# Patient Record
Sex: Female | Born: 1935 | Race: White | Hispanic: No | Marital: Single | Smoking: Former smoker
Health system: Southern US, Community
[De-identification: ages and names within clinical notes are randomized; demographics above are authoritative.]

## PROBLEM LIST (undated history)

## (undated) DIAGNOSIS — E1165 Type 2 diabetes mellitus with hyperglycemia: Secondary | ICD-10-CM

## (undated) DIAGNOSIS — I25810 Atherosclerosis of coronary artery bypass graft(s) without angina pectoris: Secondary | ICD-10-CM

## (undated) DIAGNOSIS — I10 Essential (primary) hypertension: Secondary | ICD-10-CM

## (undated) DIAGNOSIS — IMO0002 Reserved for concepts with insufficient information to code with codable children: Secondary | ICD-10-CM

## (undated) HISTORY — PX: CORONARY ARTERY BYPASS GRAFT: SHX141

---

## 2018-02-24 ENCOUNTER — Inpatient Hospital Stay (HOSPITAL_COMMUNITY)
Admission: AD | Admit: 2018-02-24 | Discharge: 2018-03-16 | DRG: 242 | Disposition: E | Payer: Medicare Other | Attending: Cardiology | Admitting: Cardiology

## 2018-02-24 ENCOUNTER — Encounter (HOSPITAL_COMMUNITY): Admission: AD | Disposition: E | Payer: Self-pay | Source: Home / Self Care | Attending: Cardiology

## 2018-02-24 ENCOUNTER — Encounter (HOSPITAL_COMMUNITY): Payer: Self-pay | Admitting: *Deleted

## 2018-02-24 ENCOUNTER — Other Ambulatory Visit: Payer: Self-pay

## 2018-02-24 ENCOUNTER — Inpatient Hospital Stay (HOSPITAL_COMMUNITY): Payer: Medicare Other

## 2018-02-24 DIAGNOSIS — I442 Atrioventricular block, complete: Secondary | ICD-10-CM | POA: Diagnosis present

## 2018-02-24 DIAGNOSIS — I25810 Atherosclerosis of coronary artery bypass graft(s) without angina pectoris: Secondary | ICD-10-CM | POA: Diagnosis present

## 2018-02-24 DIAGNOSIS — Z87891 Personal history of nicotine dependence: Secondary | ICD-10-CM | POA: Diagnosis not present

## 2018-02-24 DIAGNOSIS — I2102 ST elevation (STEMI) myocardial infarction involving left anterior descending coronary artery: Secondary | ICD-10-CM | POA: Diagnosis not present

## 2018-02-24 DIAGNOSIS — Z9289 Personal history of other medical treatment: Secondary | ICD-10-CM

## 2018-02-24 DIAGNOSIS — A419 Sepsis, unspecified organism: Secondary | ICD-10-CM | POA: Diagnosis not present

## 2018-02-24 DIAGNOSIS — Z978 Presence of other specified devices: Secondary | ICD-10-CM

## 2018-02-24 DIAGNOSIS — I452 Bifascicular block: Secondary | ICD-10-CM | POA: Diagnosis present

## 2018-02-24 DIAGNOSIS — G9340 Encephalopathy, unspecified: Secondary | ICD-10-CM | POA: Diagnosis not present

## 2018-02-24 DIAGNOSIS — I255 Ischemic cardiomyopathy: Secondary | ICD-10-CM | POA: Diagnosis present

## 2018-02-24 DIAGNOSIS — Z9911 Dependence on respirator [ventilator] status: Secondary | ICD-10-CM

## 2018-02-24 DIAGNOSIS — I213 ST elevation (STEMI) myocardial infarction of unspecified site: Secondary | ICD-10-CM | POA: Diagnosis present

## 2018-02-24 DIAGNOSIS — J9690 Respiratory failure, unspecified, unspecified whether with hypoxia or hypercapnia: Secondary | ICD-10-CM | POA: Diagnosis not present

## 2018-02-24 DIAGNOSIS — R579 Shock, unspecified: Secondary | ICD-10-CM | POA: Diagnosis not present

## 2018-02-24 DIAGNOSIS — Z515 Encounter for palliative care: Secondary | ICD-10-CM | POA: Diagnosis present

## 2018-02-24 DIAGNOSIS — J811 Chronic pulmonary edema: Secondary | ICD-10-CM | POA: Diagnosis present

## 2018-02-24 DIAGNOSIS — R6521 Severe sepsis with septic shock: Secondary | ICD-10-CM | POA: Diagnosis not present

## 2018-02-24 DIAGNOSIS — I10 Essential (primary) hypertension: Secondary | ICD-10-CM | POA: Diagnosis present

## 2018-02-24 DIAGNOSIS — Z7189 Other specified counseling: Secondary | ICD-10-CM | POA: Diagnosis not present

## 2018-02-24 DIAGNOSIS — Z66 Do not resuscitate: Secondary | ICD-10-CM | POA: Diagnosis present

## 2018-02-24 DIAGNOSIS — Z951 Presence of aortocoronary bypass graft: Secondary | ICD-10-CM

## 2018-02-24 DIAGNOSIS — J9601 Acute respiratory failure with hypoxia: Secondary | ICD-10-CM

## 2018-02-24 DIAGNOSIS — F0390 Unspecified dementia without behavioral disturbance: Secondary | ICD-10-CM | POA: Diagnosis present

## 2018-02-24 DIAGNOSIS — E1165 Type 2 diabetes mellitus with hyperglycemia: Secondary | ICD-10-CM | POA: Diagnosis present

## 2018-02-24 DIAGNOSIS — Z950 Presence of cardiac pacemaker: Secondary | ICD-10-CM

## 2018-02-24 DIAGNOSIS — I472 Ventricular tachycardia: Secondary | ICD-10-CM | POA: Diagnosis present

## 2018-02-24 DIAGNOSIS — J189 Pneumonia, unspecified organism: Secondary | ICD-10-CM

## 2018-02-24 DIAGNOSIS — IMO0002 Reserved for concepts with insufficient information to code with codable children: Secondary | ICD-10-CM

## 2018-02-24 DIAGNOSIS — I2109 ST elevation (STEMI) myocardial infarction involving other coronary artery of anterior wall: Secondary | ICD-10-CM | POA: Diagnosis present

## 2018-02-24 DIAGNOSIS — I469 Cardiac arrest, cause unspecified: Secondary | ICD-10-CM | POA: Diagnosis not present

## 2018-02-24 DIAGNOSIS — T82128A Displacement of other cardiac electronic device, initial encounter: Secondary | ICD-10-CM

## 2018-02-24 HISTORY — PX: LEFT HEART CATH AND CORONARY ANGIOGRAPHY: CATH118249

## 2018-02-24 HISTORY — DX: Atherosclerosis of coronary artery bypass graft(s) without angina pectoris: I25.810

## 2018-02-24 HISTORY — PX: TEMPORARY PACEMAKER: CATH118268

## 2018-02-24 HISTORY — DX: Reserved for concepts with insufficient information to code with codable children: IMO0002

## 2018-02-24 HISTORY — DX: Essential (primary) hypertension: I10

## 2018-02-24 HISTORY — PX: CORONARY/GRAFT ACUTE MI REVASCULARIZATION: CATH118305

## 2018-02-24 HISTORY — DX: Type 2 diabetes mellitus with hyperglycemia: E11.65

## 2018-02-24 LAB — HEMOGLOBIN A1C
Hgb A1c MFr Bld: 12.3 % — ABNORMAL HIGH (ref 4.8–5.6)
Mean Plasma Glucose: 306.31 mg/dL

## 2018-02-24 LAB — ECHOCARDIOGRAM COMPLETE
Height: 64 in
Weight: 2400 oz

## 2018-02-24 LAB — COMPREHENSIVE METABOLIC PANEL
ALK PHOS: 84 U/L (ref 38–126)
ALT: 38 U/L (ref 0–44)
ANION GAP: 10 (ref 5–15)
AST: 49 U/L — ABNORMAL HIGH (ref 15–41)
Albumin: 3.4 g/dL — ABNORMAL LOW (ref 3.5–5.0)
BUN: 20 mg/dL (ref 8–23)
CALCIUM: 8.9 mg/dL (ref 8.9–10.3)
CO2: 23 mmol/L (ref 22–32)
Chloride: 102 mmol/L (ref 98–111)
Creatinine, Ser: 0.72 mg/dL (ref 0.44–1.00)
GFR calc Af Amer: >60 mL/min (ref >60)
GFR calc non Af Amer: >60 mL/min (ref >60)
Glucose, Bld: 409 mg/dL — ABNORMAL HIGH (ref 70–99)
POTASSIUM: 3.8 mmol/L (ref 3.5–5.1)
SODIUM: 135 mmol/L (ref 135–145)
TOTAL PROTEIN: 5.6 g/dL — AB (ref 6.5–8.1)
Total Bilirubin: 1.2 mg/dL (ref 0.3–1.2)

## 2018-02-24 LAB — MRSA PCR SCREENING: MRSA BY PCR: POSITIVE — AB

## 2018-02-24 LAB — CBC
HEMATOCRIT: 42.3 % (ref 36.0–46.0)
HEMOGLOBIN: 13.9 g/dL (ref 12.0–15.0)
MCH: 29.4 pg (ref 26.0–34.0)
MCHC: 32.9 g/dL (ref 30.0–36.0)
MCV: 89.6 fL (ref 80.0–100.0)
Platelets: 252 10*3/uL (ref 150–400)
RBC: 4.72 MIL/uL (ref 3.87–5.11)
RDW: 13.2 % (ref 11.5–15.5)
WBC: 11.5 10*3/uL — ABNORMAL HIGH (ref 4.0–10.5)
nRBC: 0 % (ref 0.0–0.2)

## 2018-02-24 LAB — LIPID PANEL
CHOL/HDL RATIO: 2.3 ratio
Cholesterol: 152 mg/dL (ref 0–200)
HDL: 67 mg/dL (ref >40)
LDL Cholesterol: 70 mg/dL (ref 0–99)
Triglycerides: 74 mg/dL (ref <150)
VLDL: 15 mg/dL (ref 0–40)

## 2018-02-24 LAB — TROPONIN I
Troponin I: 0.14 ng/mL (ref <0.03)
Troponin I: 5.22 ng/mL (ref <0.03)

## 2018-02-24 LAB — PROTIME-INR
INR: 1.2
Prothrombin Time: 15.1 seconds (ref 11.4–15.2)

## 2018-02-24 LAB — GLUCOSE, CAPILLARY
GLUCOSE-CAPILLARY: 286 mg/dL — AB (ref 70–99)
Glucose-Capillary: 138 mg/dL — ABNORMAL HIGH (ref 70–99)

## 2018-02-24 SURGERY — CORONARY/GRAFT ACUTE MI REVASCULARIZATION
Anesthesia: LOCAL

## 2018-02-24 MED ORDER — MIDAZOLAM HCL 2 MG/2ML IJ SOLN
INTRAMUSCULAR | Status: AC
  Filled 2018-02-24: qty 2

## 2018-02-24 MED ORDER — CLOPIDOGREL BISULFATE 300 MG PO TABS
ORAL_TABLET | ORAL | Status: DC | PRN
  Administered 2018-02-24: 600 mg via ORAL

## 2018-02-24 MED ORDER — LIDOCAINE HCL (PF) 1 % IJ SOLN
INTRAMUSCULAR | Status: AC
  Filled 2018-02-24: qty 30

## 2018-02-24 MED ORDER — SODIUM CHLORIDE 0.9 % IV SOLN
250.0000 mL | INTRAVENOUS | Status: AC | PRN
  Administered 2018-02-24: 250 mL via INTRAVENOUS

## 2018-02-24 MED ORDER — ATORVASTATIN CALCIUM 80 MG PO TABS
80.0000 mg | ORAL_TABLET | Freq: Every day | ORAL | Status: DC
  Administered 2018-02-24 – 2018-02-25 (×2): 80 mg via ORAL
  Filled 2018-02-24 (×2): qty 1

## 2018-02-24 MED ORDER — HEPARIN (PORCINE) IN NACL 1000-0.9 UT/500ML-% IV SOLN
INTRAVENOUS | Status: DC | PRN
  Administered 2018-02-24 (×2): 500 mL

## 2018-02-24 MED ORDER — FUROSEMIDE 10 MG/ML IJ SOLN
INTRAMUSCULAR | Status: AC
  Filled 2018-02-24: qty 4

## 2018-02-24 MED ORDER — LOSARTAN POTASSIUM 25 MG PO TABS
25.0000 mg | ORAL_TABLET | Freq: Every day | ORAL | Status: DC
  Administered 2018-02-24 – 2018-02-25 (×2): 25 mg via ORAL
  Filled 2018-02-24 (×2): qty 1

## 2018-02-24 MED ORDER — SODIUM CHLORIDE 0.9% FLUSH: 3.0000 mL | INTRAVENOUS | Status: DC | PRN

## 2018-02-24 MED ORDER — NOREPINEPHRINE 4 MG/250ML-% IV SOLN
INTRAVENOUS | Status: AC
  Filled 2018-02-24: qty 250

## 2018-02-24 MED ORDER — HEPARIN (PORCINE) IN NACL 1000-0.9 UT/500ML-% IV SOLN
INTRAVENOUS | Status: AC
  Filled 2018-02-24: qty 1000

## 2018-02-24 MED ORDER — MORPHINE SULFATE (PF) 2 MG/ML IV SOLN
1.0000 mg | INTRAVENOUS | Status: AC | PRN
  Administered 2018-02-25 – 2018-02-26 (×2): 1 mg via INTRAVENOUS
  Filled 2018-02-24 (×2): qty 1

## 2018-02-24 MED ORDER — ACETAMINOPHEN 325 MG PO TABS: 650.0000 mg | ORAL_TABLET | ORAL | Status: DC | PRN

## 2018-02-24 MED ORDER — LIDOCAINE HCL (PF) 1 % IJ SOLN
INTRAMUSCULAR | Status: DC | PRN
  Administered 2018-02-24: 30 mL

## 2018-02-24 MED ORDER — IOHEXOL 350 MG/ML SOLN
INTRAVENOUS | Status: DC | PRN
  Administered 2018-02-24: 140 mL via INTRA_ARTERIAL

## 2018-02-24 MED ORDER — ATROPINE SULFATE 1 MG/10ML IJ SOSY
PREFILLED_SYRINGE | INTRAMUSCULAR | Status: DC | PRN
  Administered 2018-02-24: 1 mg via INTRAVENOUS

## 2018-02-24 MED ORDER — SODIUM CHLORIDE 0.9 % IV SOLN
INTRAVENOUS | Status: AC | PRN
  Administered 2018-02-24: 20 mL/h via INTRAVENOUS

## 2018-02-24 MED ORDER — SODIUM CHLORIDE 0.9% FLUSH
3.0000 mL | Freq: Two times a day (BID) | INTRAVENOUS | Status: DC
  Administered 2018-02-24 – 2018-02-25 (×3): 3 mL via INTRAVENOUS

## 2018-02-24 MED ORDER — ASPIRIN EC 81 MG PO TBEC
81.0000 mg | DELAYED_RELEASE_TABLET | Freq: Every day | ORAL | Status: DC
  Administered 2018-02-25: 81 mg via ORAL
  Filled 2018-02-24: qty 1

## 2018-02-24 MED ORDER — ASPIRIN 81 MG PO CHEW: 81.0000 mg | CHEWABLE_TABLET | Freq: Every day | ORAL | Status: DC

## 2018-02-24 MED ORDER — ORAL CARE MOUTH RINSE
15.0000 mL | Freq: Two times a day (BID) | OROMUCOSAL | Status: DC
  Administered 2018-02-24 – 2018-02-27 (×4): 15 mL via OROMUCOSAL

## 2018-02-24 MED ORDER — ONDANSETRON HCL 4 MG/2ML IJ SOLN
4.0000 mg | Freq: Four times a day (QID) | INTRAMUSCULAR | Status: DC | PRN
  Administered 2018-02-26: 4 mg via INTRAVENOUS
  Filled 2018-02-24: qty 2

## 2018-02-24 MED ORDER — INSULIN GLARGINE 100 UNIT/ML ~~LOC~~ SOLN
20.0000 [IU] | SUBCUTANEOUS | Status: DC
  Administered 2018-02-25: 20 [IU] via SUBCUTANEOUS
  Filled 2018-02-24 (×2): qty 0.2

## 2018-02-24 MED ORDER — CLOPIDOGREL BISULFATE 75 MG PO TABS
75.0000 mg | ORAL_TABLET | Freq: Every day | ORAL | Status: DC
  Administered 2018-02-25: 75 mg via ORAL
  Filled 2018-02-24: qty 1

## 2018-02-24 MED ORDER — ISOSORBIDE MONONITRATE ER 30 MG PO TB24
30.0000 mg | ORAL_TABLET | ORAL | Status: DC
  Administered 2018-02-25 – 2018-02-26 (×2): 30 mg via ORAL
  Filled 2018-02-24 (×2): qty 1

## 2018-02-24 MED ORDER — HEPARIN SODIUM (PORCINE) 5000 UNIT/ML IJ SOLN
5000.0000 [IU] | Freq: Three times a day (TID) | INTRAMUSCULAR | Status: DC
  Administered 2018-02-24 – 2018-02-26 (×5): 5000 [IU] via SUBCUTANEOUS
  Filled 2018-02-24 (×5): qty 1

## 2018-02-24 MED ORDER — FENTANYL CITRATE (PF) 100 MCG/2ML IJ SOLN
INTRAMUSCULAR | Status: AC
  Filled 2018-02-24: qty 2

## 2018-02-24 MED ORDER — ASPIRIN 300 MG RE SUPP: 300.0000 mg | RECTAL | Status: AC

## 2018-02-24 MED ORDER — NOREPINEPHRINE BITARTRATE 1 MG/ML IV SOLN
INTRAVENOUS | Status: DC | PRN
  Administered 2018-02-24: 20 ug/min via INTRAVENOUS

## 2018-02-24 MED ORDER — HEPARIN SODIUM (PORCINE) 1000 UNIT/ML IJ SOLN
INTRAMUSCULAR | Status: DC | PRN
  Administered 2018-02-24: 4000 [IU] via INTRAVENOUS
  Administered 2018-02-24: 5000 [IU] via INTRAVENOUS

## 2018-02-24 MED ORDER — DOPAMINE-DEXTROSE 3.2-5 MG/ML-% IV SOLN
INTRAVENOUS | Status: DC | PRN
  Administered 2018-02-24: 5 ug/kg/min via INTRAVENOUS

## 2018-02-24 MED ORDER — FUROSEMIDE 10 MG/ML IJ SOLN
INTRAMUSCULAR | Status: DC | PRN
  Administered 2018-02-24: 40 mg via INTRAVENOUS

## 2018-02-24 MED ORDER — INSULIN ASPART 100 UNIT/ML ~~LOC~~ SOLN
SUBCUTANEOUS | Status: DC | PRN
  Administered 2018-02-24: 10 [IU] via SUBCUTANEOUS

## 2018-02-24 MED ORDER — ACETAMINOPHEN 325 MG PO TABS
650.0000 mg | ORAL_TABLET | ORAL | Status: DC | PRN
  Administered 2018-02-24 – 2018-02-26 (×4): 650 mg via ORAL
  Filled 2018-02-24 (×5): qty 2

## 2018-02-24 MED ORDER — ONDANSETRON HCL 4 MG/2ML IJ SOLN: 4.0000 mg | Freq: Four times a day (QID) | INTRAMUSCULAR | Status: DC | PRN

## 2018-02-24 MED ORDER — DOCUSATE SODIUM 100 MG PO CAPS
200.0000 mg | ORAL_CAPSULE | Freq: Every day | ORAL | Status: DC
  Administered 2018-02-24 – 2018-02-25 (×2): 200 mg via ORAL
  Filled 2018-02-24 (×3): qty 2

## 2018-02-24 MED ORDER — NITROGLYCERIN 0.4 MG SL SUBL: 0.4000 mg | SUBLINGUAL_TABLET | SUBLINGUAL | Status: DC | PRN

## 2018-02-24 MED ORDER — MIDAZOLAM HCL 2 MG/2ML IJ SOLN
INTRAMUSCULAR | Status: DC | PRN
  Administered 2018-02-24: 1 mg via INTRAVENOUS

## 2018-02-24 MED ORDER — NITROGLYCERIN 1 MG/10 ML FOR IR/CATH LAB
INTRA_ARTERIAL | Status: DC | PRN
  Administered 2018-02-24: 200 ug via INTRACORONARY

## 2018-02-24 MED ORDER — ALPRAZOLAM 0.5 MG PO TABS
0.5000 mg | ORAL_TABLET | Freq: Two times a day (BID) | ORAL | Status: DC | PRN
  Administered 2018-02-24 – 2018-02-26 (×3): 0.5 mg via ORAL
  Filled 2018-02-24 (×3): qty 1

## 2018-02-24 MED ORDER — CHLORHEXIDINE GLUCONATE CLOTH 2 % EX PADS
6.0000 | MEDICATED_PAD | Freq: Every day | CUTANEOUS | Status: DC
  Administered 2018-02-25 – 2018-02-28 (×4): 6 via TOPICAL

## 2018-02-24 MED ORDER — HYDRALAZINE HCL 20 MG/ML IJ SOLN: 5.0000 mg | INTRAMUSCULAR | Status: AC | PRN

## 2018-02-24 MED ORDER — DOPAMINE-DEXTROSE 3.2-5 MG/ML-% IV SOLN
INTRAVENOUS | Status: AC
  Filled 2018-02-24: qty 250

## 2018-02-24 MED ORDER — PANTOPRAZOLE SODIUM 40 MG PO TBEC
40.0000 mg | DELAYED_RELEASE_TABLET | Freq: Every day | ORAL | Status: DC
  Administered 2018-02-24 – 2018-02-25 (×2): 40 mg via ORAL
  Filled 2018-02-24 (×2): qty 1

## 2018-02-24 MED ORDER — MUPIROCIN 2 % EX OINT
1.0000 "application " | TOPICAL_OINTMENT | Freq: Two times a day (BID) | CUTANEOUS | Status: DC
  Administered 2018-02-24 – 2018-02-27 (×7): 1 via NASAL
  Filled 2018-02-24 (×3): qty 22

## 2018-02-24 MED ORDER — FENTANYL CITRATE (PF) 100 MCG/2ML IJ SOLN
INTRAMUSCULAR | Status: DC | PRN
  Administered 2018-02-24: 50 ug via INTRAVENOUS

## 2018-02-24 MED ORDER — ASPIRIN 81 MG PO CHEW: 324.0000 mg | CHEWABLE_TABLET | ORAL | Status: AC

## 2018-02-24 MED ORDER — INSULIN ASPART 100 UNIT/ML ~~LOC~~ SOLN
0.0000 [IU] | Freq: Three times a day (TID) | SUBCUTANEOUS | Status: DC
  Administered 2018-02-24: 3 [IU] via SUBCUTANEOUS
  Administered 2018-02-24: 8 [IU] via SUBCUTANEOUS
  Administered 2018-02-25: 5 [IU] via SUBCUTANEOUS
  Administered 2018-02-25 – 2018-02-26 (×5): 3 [IU] via SUBCUTANEOUS

## 2018-02-24 MED ORDER — LABETALOL HCL 5 MG/ML IV SOLN: 10.0000 mg | INTRAVENOUS | Status: AC | PRN

## 2018-02-24 MED ORDER — CLOPIDOGREL BISULFATE 300 MG PO TABS
ORAL_TABLET | ORAL | Status: AC
  Filled 2018-02-24: qty 2

## 2018-02-24 MED ORDER — NITROGLYCERIN 1 MG/10 ML FOR IR/CATH LAB
INTRA_ARTERIAL | Status: AC
  Filled 2018-02-24: qty 10

## 2018-02-24 SURGICAL SUPPLY — 22 items
BALLN EMERGE MR 2.0X8 (BALLOONS) ×2
BALLN EUPHORA RX 3.0X6 (BALLOONS) ×2
BALLOON EMERGE MR 2.0X8 (BALLOONS) ×1 IMPLANT
BALLOON EUPHORA RX 3.0X6 (BALLOONS) ×1 IMPLANT
CABLE ADAPT CONN TEMP 6FT (ADAPTER) ×2 IMPLANT
CATH INFINITI 5 FR IM (CATHETERS) ×2 IMPLANT
CATH INFINITI 5FR MULTPACK ANG (CATHETERS) ×2 IMPLANT
CATH LAUNCHER 6FR AL.75 (CATHETERS) ×2 IMPLANT
CATH S G BIP PACING (SET/KITS/TRAYS/PACK) ×2 IMPLANT
CATHETER LAUNCHER 6FR MP1 (CATHETERS) ×2 IMPLANT
DEVICE SPIDERFX EMB PROT 4MM (WIRE) ×2 IMPLANT
FILTERWIRE EZ 3.5-5.5 190CM (FILTER) ×4 IMPLANT
KIT ENCORE 26 ADVANTAGE (KITS) ×2 IMPLANT
KIT HEART LEFT (KITS) ×2 IMPLANT
KIT MICROPUNCTURE NIT STIFF (SHEATH) ×2 IMPLANT
PACK CARDIAC CATHETERIZATION (CUSTOM PROCEDURE TRAY) ×2 IMPLANT
SHEATH PINNACLE 6F 10CM (SHEATH) ×4 IMPLANT
STENT SYNERGY DES 4X8 (Permanent Stent) ×2 IMPLANT
TRANSDUCER W/STOPCOCK (MISCELLANEOUS) ×2 IMPLANT
TUBING CIL FLEX 10 FLL-RA (TUBING) ×2 IMPLANT
WIRE ASAHI PROWATER 180CM (WIRE) ×2 IMPLANT
WIRE EMERALD 3MM-J .035X150CM (WIRE) ×2 IMPLANT

## 2018-02-24 NOTE — H&P (Addendum)
Crystal Moran is an 84 y.o. female.   Chief Complaint: Chest pain HPI:   Crystal Moran is a 84 y.o. Female with CAD s/o CABG in 1980s, hypertension, type 2 DM, ?mild dementia, presented to [facility] ED with intermittent chest pain since 02/23/18 evening, got worse early this morning. EKG showed baseline sinus rhythm with RBBB, new aVR and V1 STE with diffuse ST depression in inferolateral leads. She was thus emergently taken to cath lab.  Cath showed severe crepitation in proximal part of SVG-LAD, and nonculprit severe calcific disease in LM-LCx. Patient with successful PTCA and stent placement SVG-LAD (Synergy DES 4.0 X 8 mm). Patient required emergent transvenous pacemaker placement prior to intervention due to asystole.  Past Medical History:  Diagnosis Date  . Coronary artery disease involving autologous vein bypass graft   . Hypertension   . Uncontrolled type 2 diabetes mellitus ([place])     Past Surgical History:  Procedure Laterality Date  . CORONARY ARTERY BYPASS GRAFT     1980s (SVG-LAD)    History reviewed. No pertinent family history. Social History:  reports that she has quit smoking. She does not have any smokeless tobacco history on file. She reports that she drank alcohol. She reports that she does not use drugs.  Allergies: No Known Allergies  No medications prior to admission.    Results for orders placed or performed during the hospital encounter of 03/01/2018 (from the past 48 hour(s))  Comprehensive metabolic panel     Status: Abnormal   Collection Time: 03/08/2018  9:45 AM  Result Value Ref Range   Sodium 135 135 - 145 mmol/L   Potassium 3.8 3.5 - 5.1 mmol/L   Chloride 102 98 - 111 mmol/L   CO2 23 22 - 32 mmol/L   Glucose, Bld 409 (H) 70 - 99 mg/dL   BUN 20 8 - 23 mg/dL   Creatinine, Ser 0.72 0.44 - 1.00 mg/dL   Calcium 8.9 8.9 - 10.3 mg/dL   Total Protein 5.6 (L) 6.5 - 8.1 g/dL   Albumin 3.4 (L) 3.5 - 5.0 g/dL   AST 49 (H) 15 - 41  U/L   ALT 38 0 - 44 U/L   Alkaline Phosphatase 84 38 - 126 U/L   Total Bilirubin 1.2 0.3 - 1.2 mg/dL   GFR calc non Af Amer >60 >60 mL/min   GFR calc Af Amer >60 >60 mL/min    Comment: (NOTE) The eGFR has been calculated using the CKD EPI equation. This calculation has not been validated in all clinical situations. eGFR's persistently <60 mL/min signify possible Chronic Kidney Disease.    Anion gap 10 5 - 15    Comment: Performed at [facility] [address], [place], [place] [address]  Lipid panel     Status: None   Collection Time: 03/06/2018  9:45 AM  Result Value Ref Range   Cholesterol 152 0 - 200 mg/dL   Triglycerides 74 <150 mg/dL   HDL 67 >40 mg/dL   Total CHOL/HDL Ratio 2.3 RATIO   VLDL 15 0 - 40 mg/dL   LDL Cholesterol 70 0 - 99 mg/dL    Comment:        Total Cholesterol/HDL:CHD Risk Coronary Heart Disease Risk Table                     Men   Women  1/2 Average Risk   3.4   3.3  Average Risk  5.0   4.4  2 X Average Risk   9.6   7.1  3 X Average Risk  23.4   11.0        Use the calculated Patient Ratio above and the CHD Risk Table to determine the patient's CHD Risk.        ATP III CLASSIFICATION (LDL):  <100     mg/dL   Optimal  100-129  mg/dL   Near or Above                    Optimal  130-159  mg/dL   Borderline  160-189  mg/dL   High  >190     mg/dL   Very High Performed at [facility] [address], [place], [place] [address]   Hemoglobin A1c     Status: Abnormal   Collection Time: 03/06/2018  9:45 AM  Result Value Ref Range   Hgb A1c MFr Bld 12.3 (H) 4.8 - 5.6 %    Comment: (NOTE) Pre diabetes:          5.7%-6.4% Diabetes:              >6.4% Glycemic control for   <7.0% adults with diabetes    Mean Plasma Glucose 306.31 mg/dL    Comment: Performed at [facility] [address] [place], [place] [address]  CBC     Status: Abnormal   Collection Time: 02/16/2018  9:45 AM  Result Value Ref Range   WBC 11.5 (H) 4.0 - 10.5  K/uL   RBC 4.72 3.87 - 5.11 MIL/uL   Hemoglobin 13.9 12.0 - 15.0 g/dL   HCT 42.3 36.0 - 46.0 %   MCV 89.6 80.0 - 100.0 fL   MCH 29.4 26.0 - 34.0 pg   MCHC 32.9 30.0 - 36.0 g/dL   RDW 13.2 11.5 - 15.5 %   Platelets 252 150 - 400 K/uL   nRBC 0.0 0.0 - 0.2 %    Comment: Performed at [facility], [place] [address] [place], [place] [address]  Protime-INR     Status: None   Collection Time: 02/15/2018  9:45 AM  Result Value Ref Range   Prothrombin Time 15.1 11.4 - 15.2 seconds   INR 1.20     Comment: Performed at [facility] [address], [place], [place] [address]  APTT     Status: Abnormal   Collection Time: 02/23/2018  9:45 AM  Result Value Ref Range   aPTT >200 (HH) 24 - 36 seconds    Comment:        IF BASELINE aPTT IS ELEVATED, SUGGEST PATIENT RISK ASSESSMENT BE USED TO DETERMINE APPROPRIATE ANTICOAGULANT THERAPY. CRITICAL RESULT CALLED TO, READ BACK BY AND VERIFIED WITH: Tessie Eke RN AT 1032 03/08/2018 BY [facility] Performed at [facility], [place] [address] [place], [place] [address]   Troponin I     Status: Abnormal   Collection Time: 03/13/2018  9:45 AM  Result Value Ref Range   Troponin I 0.14 (HH) <0.03 ng/mL    Comment: CRITICAL RESULT CALLED TO, READ BACK BY AND VERIFIED WITH: RN A PIFER AT [id number] MARTINB Performed at [facility], [address] [place], [place] [address]   Glucose, capillary     Status: Abnormal   Collection Time: 02/15/2018 12:28 PM  Result Value Ref Range   Glucose-Capillary 286 (H) 70 - 99 mg/dL   Comment 1 Notify RN  No results found.  Review of Systems  Constitutional: Negative.   HENT: Negative.   Respiratory: Positive for shortness of breath (On arrival. Now improved).   Cardiovascular: Positive for chest pain (On arrival. Now improved).  Gastrointestinal: Negative for abdominal pain, nausea and vomiting.  Genitourinary: Negative.   Musculoskeletal: Positive for joint pain.  Skin: Negative.    Neurological: Negative for dizziness and loss of consciousness.  Endo/Heme/Allergies: Does not bruise/bleed easily.  Psychiatric/Behavioral: The patient is nervous/anxious.   All other systems reviewed and are negative.   Blood pressure 117/76, pulse 100, temperature 97.7 F (36.5 C), temperature source Oral, resp. rate 19, height 5' 4"  (1.626 m), weight 74 kg, SpO2 98 %. Physical Exam  Nursing note and vitals reviewed. Constitutional: She is oriented to person, place, and time. She appears well-developed and well-nourished. No distress.  HENT:  Head: Normocephalic and atraumatic.  Eyes: Pupils are equal, round, and reactive to light. Conjunctivae are normal.  Neck: Normal range of motion. Neck supple. No JVD present.  Cardiovascular: Normal rate, regular rhythm and normal heart sounds.  No murmur heard. Pulses:      Femoral pulses are 2+ on the right side, and 2+ on the left side.      Dorsalis pedis pulses are 1+ on the right side, and 1+ on the left side.       Posterior tibial pulses are 1+ on the right side, and 1+ on the left side.  Respiratory: Effort normal and breath sounds normal. She has no wheezes. She has no rales.  Sternotomy scar  GI: Soft. Bowel sounds are normal. There is no tenderness. There is no rebound.  Musculoskeletal: She exhibits edema (Trace b/l).  Lymphadenopathy:    She has no cervical adenopathy.  Neurological: She is alert and oriented to person, place, and time. No cranial nerve deficit.  Skin: Skin is warm and dry.  Psychiatric: She has a normal mood and affect.     Cardiac studies:  EKG 02/23/2018 post PCI: Sinus rhythm 100 bpm. Right bundle branch block. Inferior T-wave inversions in lateral ST depressions improved compared to EKG on arrival  Cath 03/12/2018: LM-LCx/OM1: Severe calcific 95-99% disease (Likely chronic, non-culprit) LAD: Ostially occluded, bypassed by SVG-LAD. Mid LAD 99% stenosis LCx: As above.  RCA: Large vessel. Mid 30%  stenosis SVG-LAD: Proximal 99% stenosis. (Culprit) Successful PTCA and stent placemnt Synergy DES 4.0 X 8 mm 0% residual stenosis  Hospital echocardiogram 03/08/2018: - Left ventricle: The cavity size was normal. The estimated   ejection fraction was in the range of 40% to 45%. Wall motion was   normal; there were no regional wall motion abnormalities. The   study is not technically sufficient to allow evaluation of LV   diastolic function. - Aortic valve: Moderately calcified annulus. - Inadequate Doppler evaluation of mitral valve. Mild mitral   stenosis cannot be excluded.   No significant valvular regurgitation.  Assessment: Successful culprit artery revascularization to SVG-LAD Synergy 4.0 x 8 mm drug-eluting stent Residual severe calcific disease LM-LCx Transient asystole in cath lab requiring CPR and temporary pacemaker Uncontrolled type 2 DM Possible mild dementia  Plan: Admit to telemetry Dual antiplatelet therapy with aspirin and Plavix for at least one year Monitor for bradycardia arrhythmias. Keep temporary pacemaker in place with backup rate of 30 bpm If no further bradycardia arrhythmias, could potentially removed on 02/25/2018. Off note, there was significant kinking of the temporary pacemaker during the placement. If any difficulty encountered during the mobile, this may  need to be removed under fluoroscopy. We will monitor patient for any further chest pain. Optimal revascularization for her calcific left main/left circumflex would require atherectomy. Given patient's advanced age and multiple comorbidities, will need to discuss risks and benefits in detail. Add losartan 25 mg today, given patient's h/o cough to lisinopril. Hold beta blocker for now pending stabilization of bradyarrhtymia Insulin coverage for DM  Nigel Mormon, MD 02/18/2018, 1:21 PM  [place], MD [facility] [place] Pager: [phone] Office: [phone] If no  answer Cell [phone]

## 2018-02-24 NOTE — Progress Notes (Signed)
  Echocardiogram 2D Echocardiogram has been performed.  Delcie Roch 02/23/2018, 12:56 PM

## 2018-02-24 NOTE — Progress Notes (Signed)
Critical Troponin I  Level 5.22 received from lab. Values expected with MI. Will notify physician with rounds.

## 2018-02-25 ENCOUNTER — Encounter (HOSPITAL_COMMUNITY): Payer: Self-pay | Admitting: *Deleted

## 2018-02-25 ENCOUNTER — Inpatient Hospital Stay (HOSPITAL_COMMUNITY): Payer: Medicare Other

## 2018-02-25 DIAGNOSIS — I452 Bifascicular block: Secondary | ICD-10-CM

## 2018-02-25 DIAGNOSIS — I2102 ST elevation (STEMI) myocardial infarction involving left anterior descending coronary artery: Secondary | ICD-10-CM

## 2018-02-25 DIAGNOSIS — I442 Atrioventricular block, complete: Secondary | ICD-10-CM

## 2018-02-25 LAB — GLUCOSE, CAPILLARY
GLUCOSE-CAPILLARY: 196 mg/dL — AB (ref 70–99)
GLUCOSE-CAPILLARY: 100 mg/dL — AB (ref 70–99)
Glucose-Capillary: 228 mg/dL — ABNORMAL HIGH (ref 70–99)
Glucose-Capillary: 161 mg/dL — ABNORMAL HIGH (ref 70–99)

## 2018-02-25 LAB — BASIC METABOLIC PANEL
ANION GAP: 10 (ref 5–15)
BUN: 15 mg/dL (ref 8–23)
CALCIUM: 8.4 mg/dL — AB (ref 8.9–10.3)
CO2: 22 mmol/L (ref 22–32)
Chloride: 104 mmol/L (ref 98–111)
Creatinine, Ser: 0.48 mg/dL (ref 0.44–1.00)
GFR calc Af Amer: >60 mL/min (ref >60)
GFR calc non Af Amer: >60 mL/min (ref >60)
GLUCOSE: 176 mg/dL — AB (ref 70–99)
POTASSIUM: 3.4 mmol/L — AB (ref 3.5–5.1)
Sodium: 136 mmol/L (ref 135–145)

## 2018-02-25 LAB — TROPONIN I: Troponin I: 10.98 ng/mL (ref <0.03)

## 2018-02-25 LAB — APTT: aPTT: >200 seconds (ref 24–36)

## 2018-02-25 LAB — CBC
HCT: 35.9 % — ABNORMAL LOW (ref 36.0–46.0)
HEMOGLOBIN: 11.6 g/dL — AB (ref 12.0–15.0)
MCH: 29.1 pg (ref 26.0–34.0)
MCHC: 32.3 g/dL (ref 30.0–36.0)
MCV: 90 fL (ref 80.0–100.0)
Platelets: 177 10*3/uL (ref 150–400)
RBC: 3.99 MIL/uL (ref 3.87–5.11)
RDW: 13.6 % (ref 11.5–15.5)
WBC: 8.6 10*3/uL (ref 4.0–10.5)
nRBC: 0 % (ref 0.0–0.2)

## 2018-02-25 MED ORDER — CHLORHEXIDINE GLUCONATE 4 % EX LIQD
60.0000 mL | Freq: Once | CUTANEOUS | Status: AC
  Administered 2018-02-25: 4 via TOPICAL
  Filled 2018-02-25: qty 15

## 2018-02-25 MED ORDER — CHLORHEXIDINE GLUCONATE 4 % EX LIQD
CUTANEOUS | Status: AC
  Administered 2018-02-25: 4 via TOPICAL
  Filled 2018-02-25: qty 45

## 2018-02-25 MED ORDER — SODIUM CHLORIDE 0.9 % IV SOLN
INTRAVENOUS | Status: DC
  Administered 2018-02-26: 06:00:00 via INTRAVENOUS

## 2018-02-25 MED ORDER — VANCOMYCIN HCL IN DEXTROSE 1-5 GM/200ML-% IV SOLN
1000.0000 mg | INTRAVENOUS | Status: AC
  Administered 2018-02-26: 1000 mg via INTRAVENOUS
  Filled 2018-02-25: qty 200

## 2018-02-25 MED ORDER — ROSUVASTATIN CALCIUM 20 MG PO TABS
20.0000 mg | ORAL_TABLET | Freq: Every day | ORAL | Status: DC
  Administered 2018-02-25 – 2018-02-26 (×2): 20 mg via ORAL
  Filled 2018-02-25 (×2): qty 1

## 2018-02-25 MED ORDER — POTASSIUM CHLORIDE CRYS ER 20 MEQ PO TBCR
40.0000 meq | EXTENDED_RELEASE_TABLET | Freq: Once | ORAL | Status: AC
  Administered 2018-02-25: 40 meq via ORAL
  Filled 2018-02-25: qty 2

## 2018-02-25 MED ORDER — LIDOCAINE 5 % EX PTCH
1.0000 | MEDICATED_PATCH | CUTANEOUS | Status: DC
  Administered 2018-02-25: 1 via TRANSDERMAL
  Filled 2018-02-25 (×4): qty 1

## 2018-02-25 MED ORDER — SODIUM CHLORIDE 0.9 % IV SOLN
80.0000 mg | INTRAVENOUS | Status: AC
  Administered 2018-02-26: 80 mg
  Filled 2018-02-25: qty 2

## 2018-02-25 MED ORDER — SODIUM CHLORIDE 0.9 % IV SOLN: INTRAVENOUS | Status: DC

## 2018-02-25 MED ORDER — CHLORHEXIDINE GLUCONATE 4 % EX LIQD
60.0000 mL | Freq: Once | CUTANEOUS | Status: AC
  Administered 2018-02-26: 4 via TOPICAL
  Filled 2018-02-25: qty 60

## 2018-02-25 NOTE — Plan of Care (Signed)
  Problem: Clinical Measurements: Goal: Ability to maintain clinical measurements within normal limits will improve Outcome: Progressing Goal: Will remain free from infection Outcome: Progressing Goal: Respiratory complications will improve Outcome: Progressing Goal: Cardiovascular complication will be avoided Outcome: Progressing   Problem: Nutrition: Goal: Adequate nutrition will be maintained Outcome: Progressing   Problem: Coping: Goal: Level of anxiety will decrease Outcome: Progressing   Problem: Elimination: Goal: Will not experience complications related to bowel motility Outcome: Progressing Goal: Will not experience complications related to urinary retention Outcome: Progressing   Problem: Pain Managment: Goal: General experience of comfort will improve Outcome: Progressing   Problem: Safety: Goal: Ability to remain free from injury will improve Outcome: Progressing   Problem: Skin Integrity: Goal: Risk for impaired skin integrity will decrease Outcome: Progressing   Problem: Cardiac: Goal: Ability to achieve and maintain adequate cardiopulmonary perfusion will improve Outcome: Progressing Note:  Transvenous pacer in place. Requiring pacer at times. Plan for permanent pacemaker in the Am.

## 2018-02-25 NOTE — Consult Note (Signed)
   ELECTROPHYSIOLOGY CONSULT NOTE  Patient ID: Crystal Moran, MRN: [id number], DOB/AGE: 82/11/1928 82 y.o. Admit date: 03/04/2018 Date of Consult: 02/25/2018  Primary Physician: Buchanan, Linda, MD Primary Cardiologist: MP Crystal Moran is a 84 y.o. female who is being seen today for the evaluation of intermittent complete heart block at the request of Dr MP.   Chief Complaint: CHB   HPI Crystal Moran is a 84 y.o. female admitted 10/12 with chest pain.  ECGs were apparently demonstrative of ST elevation (not available) she was taken to the Cath Lab >> LM-LCx/OM1: Severe calcific 95-99% disease (Likely   chronic, non-culprit)  LAD: Ostially occluded, bypassed by SVG-LAD. Mid LAD 99% stenosis  LCx: As above.   RCA: Large vessel. Mid 30% stenosis  SVG-LAD: Proximal 99% stenosis. (Culprit)  Successful PTCA and stent placemnt Synergy DES 4.0 X 8 mm  Echo demonstrated EF of 40-45%  No prior syncope except for the birth of her first child decades decades ago  Under a great deal of psychosocial stress related to issues between her children and financials and paperwork   Past Medical History:  Diagnosis Date  . Coronary artery disease involving autologous vein bypass graft   . Hypertension   . Uncontrolled type 2 diabetes mellitus ([facility])       Surgical History:  Past Surgical History:  Procedure Laterality Date  . CORONARY ARTERY BYPASS GRAFT     1980s (SVG-LAD)     Home Meds: Prior to Admission medications   Medication Sig Start Date End Date Taking? Authorizing Provider  acetaminophen (TYLENOL) 325 MG tablet Take 650 mg by mouth every 4 (four) hours as needed for mild pain.   Yes [provider]  ALPRAZolam (XANAX) 0.5 MG tablet Take 0.5 mg by mouth 2 (two) times daily.   Yes [provider]  aspirin EC 81 MG tablet Take 81 mg by mouth daily.   Yes [provider]  atorvastatin (LIPITOR) 20 MG tablet Take 20 mg by  mouth daily.   Yes [provider]  diclofenac sodium (VOLTAREN) 1 % GEL Apply 2 g topically 4 (four) times daily.   Yes [provider]  docusate sodium (COLACE) 100 MG capsule Take 200 mg by mouth at bedtime.   Yes [provider]  insulin glargine (LANTUS) 100 UNIT/ML injection Inject 20 Units into the skin every morning.   Yes [provider]  isosorbide mononitrate (IMDUR) 30 MG 24 hr tablet Take 30 mg by mouth every morning.   Yes [provider]  metoprolol succinate (TOPROL-XL) 25 MG 24 hr tablet Take 25 mg by mouth daily.   Yes [provider]  nitroGLYCERIN (NITROSTAT) 0.4 MG SL tablet Place 0.4 mg under the tongue every 5 (five) minutes as needed for chest pain.   Yes [provider]  pantoprazole (PROTONIX) 40 MG tablet Take 40 mg by mouth daily.   Yes [provider]    Inpatient Medications:  . aspirin EC  81 mg Oral Daily  . Chlorhexidine Gluconate Cloth  6 each Topical Q0600  . clopidogrel  75 mg Oral Q breakfast  . docusate sodium  200 mg Oral QHS  . heparin  5,000 Units Subcutaneous Q8H  . insulin aspart  0-15 Units Subcutaneous TID WC  . insulin glargine  20 Units Subcutaneous BH-q7a  . isosorbide mononitrate  30 mg Oral BH-q7a  . lidocaine  1 patch Transdermal Q24H  . losartan  25 mg Oral Daily  .   mouth rinse  15 mL Mouth Rinse BID  . mupirocin ointment  1 application Nasal BID  . pantoprazole  40 mg Oral Daily  . rosuvastatin  20 mg Oral q1800  . sodium chloride flush  3 mL Intravenous Q12H     Allergies:  Allergies  Allergen Reactions  . Salmeterol Shortness Of Breath    Resp.   . Ranitidine Swelling  . Ace Inhibitors     cough  . Amlodipine     Fluid retention  . Amlodipine Besylate Swelling    Retains fluid  . Amoxicillin-Pot Clavulanate Nausea Only    Abd pain   . Clindamycin   . Clopidogrel Nausea And Vomiting  . Clopidogrel Bisulfate Nausea Only  . Desloratadine Tinitus     H/a-face and neck flushing, tinnitus   . Felodipine     Chest pain/arm pain  . Fluticasone-Salmeterol     Resp.   . Iodine Swelling  . Kenalog  [Triamcinolone]     Chest pain   . Other     hayfever  . Pollen Extract   . Singulair  [Montelukast] Swelling    Muscle/bone pain-weight gain-h/a  . Synthroid  [Levothyroxine]     H/a, elevated BP  . Tiotropium     Pain in both arms/chest  . Tiotropium Bromide Monohydrate   . Triamcinolone Acetonide Nausea And Vomiting    Chest pain/nausea  . Fexofenadine Anxiety    H/a-face and neck flushing-tinnitus   . Latex Rash    Social History   Socioeconomic History  . Marital status: Single    Spouse name: Not on file  . Number of children: Not on file  . Years of education: Not on file  . Highest education level: Not on file  Occupational History  . Not on file  Social Needs  . Financial resource strain: Not on file  . Food insecurity:    Worry: Not on file    Inability: Not on file  . Transportation needs:    Medical: Not on file    Non-medical: Not on file  Tobacco Use  . Smoking status: Former Smoker    Packs/day: 2.00    Years: 18.00    Pack years: 36.00    Types: Cigarettes    Start date: 02/25/1961    Last attempt to quit: 02/26/1979    Years since quitting: 39.0  . Smokeless tobacco: Never Used  Substance and Sexual Activity  . Alcohol use: Not Currently  . Drug use: Never  . Sexual activity: Not on file  Lifestyle  . Physical activity:    Days per week: Not on file    Minutes per session: Not on file  . Stress: Not on file  Relationships  . Social connections:    Talks on phone: Not on file    Gets together: Not on file    Attends religious service: Not on file    Active member of club or organization: Not on file    Attends meetings of clubs or organizations: Not on file    Relationship status: Not on file  . Intimate partner violence:    Fear of current or ex partner: Not on file    Emotionally  abused: Not on file    Physically abused: Not on file    Forced sexual activity: Not on file  Other Topics Concern  . Not on file  Social History Narrative   Patient lives in [place], [place] with her son Kenneth Hamric, who is   is also her HCPOA. Patient has another two children, but she is not in good terms with them. Patient is able to perform most ADL's independently. She is able to light cooking for herself. She only goes out occasionally to the church, and to her doctor's  appointments. . She last saw a cardiologist in [place] over two years ago (>before 2017). She is mentally alert, but tends to forget things in "stressful situations", as per her son.      Family History  Problem Relation Age of Onset  . Heart disease Son      ROS:  Please see the history of present illness.     All other systems reviewed and negative.    Physical Exam: Blood pressure (!) 102/42, pulse 94, temperature 99.1 F (37.3 C), temperature source Oral, resp. rate 19, height 5' 4" (1.626 m), weight 74 kg, SpO2 97 %. General: Well developed, well nourished female in no acute distress. Head: Normocephalic, atraumatic, sclera non-icteric, no xanthomas, nares are without discharge. EENT: normal Lymph Nodes:  none Back: without scoliosis/kyphosis, no CVA tendersness Neck: Negative for carotid bruits. JVD not elevated. Lungs: Clear bilaterally to auscultation without wheezes, rales, or rhonchi. Breathing is unlabored. Heart: RRR with S1 S2. 2/6 systolic murmur , rubs, or gallops appreciated. Abdomen: Soft, non-tender, non-distended with normoactive bowel sounds. No hepatomegaly. No rebound/guarding. No obvious abdominal masses. Msk:  Strength and tone appear normal for age. Extremities: No clubbing or cyanosis. N0 edema.  Distal pedal pulses are 2+ and equal bilaterally. Skin: Warm and Dry Neuro: Alert and oriented X 3. CN III-XII intact Grossly normal sensory and motor function . Psych:  Responds to  questions appropriately with a normal affect.      Labs: Cardiac Enzymes Recent Labs    03/02/2018 0945 02/20/2018 1318 02/25/18 0437  TROPONINI 0.14* 5.22* 10.98*   CBC Lab Results  Component Value Date   WBC 8.6 02/25/2018   HGB 11.6 (L) 02/25/2018   HCT 35.9 (L) 02/25/2018   MCV 90.0 02/25/2018   PLT 177 02/25/2018   PROTIME: Recent Labs    03/05/2018 0945  LABPROT 15.1  INR 1.20   Chemistry  Recent Labs  Lab 03/14/2018 0945 02/25/18 0437  NA 135 136  K 3.8 3.4*  CL 102 104  CO2 23 22  BUN 20 15  CREATININE 0.72 0.48  CALCIUM 8.9 8.4*  PROT 5.6*  --   BILITOT 1.2  --   ALKPHOS 84  --   ALT 38  --   AST 49*  --   GLUCOSE 409* 176*   Lipids Lab Results  Component Value Date   CHOL 152 02/14/2018   HDL 67 03/13/2018   LDLCALC 70 02/13/2018   TRIG 74 03/10/2018   BNP No results found for: PROBNP Thyroid Function Tests: No results for input(s): TSH, T4TOTAL, T3FREE, THYROIDAB in the last 72 hours.  Invalid input(s): FREET3    Miscellaneous No results found for: DDIMER  Radiology/Studies:  Dg Chest Port 1 View  Result Date: 02/25/2018 CLINICAL DATA:  Pacemaker displacement. Evaluate transvenous pacer lead. EXAM: PORTABLE CHEST 1 VIEW COMPARISON:  02/11/2018 FINDINGS: Femoral transvenous pacer lead is present. Pacer lead in the region of the right ventricle. Cardiac pads overlying the chest. Chronic elevation of the right hemidiaphragm. Coarse interstitial lung markings particularly at the lung bases appear chronic. No focal airspace disease. Heart and mediastinum are stable. Prior median sternotomy. Trachea is midline. IMPRESSION: 1. Transvenous pacer wire in the right ventricle   region. 2. Prominent interstitial lung markings appear chronic. Difficult to exclude mild edema. Electronically Signed   By: Adam  Henn M.D.   On: 02/25/2018 08:22    EKG: 7/17  Sinus with QRS d 86 msec 10/19 Sinus with RBBB and LPFB  ( new from 7/17)   Tel recurrent complete  heart block requiring pacing   Assessment and Plan:  Anterior Wall MI 2/2 SVG-LAD lesion  RBBB/LPFB new from 20-17  Intermittent CHB  Ischemic cardiomyopathy 40-45% EF  Dementia mild-likely she says it per her daughter's history  HTN  DM  Patient with new conduction system disease, right bundle left posterior fascicular block and intermittent heart block in the context of anterior wall ischemic injury, MI.  I doubt that she will recover stable conduction and will anticipate CRT-P (if possible-LV lead) implantation tomorrow.      Have reviewed benefits and risks.   Steven Klein  

## 2018-02-25 NOTE — Progress Notes (Signed)
Subjective:  Chest soreness at the site of CPR, worse with movement, laughing etc. No chest pain similar to what brought her to the hospital.  Trop peak 10.9.   Episodes of high grave AV block overnight requiring temporary pacing  Objective:  Vital Signs in the last 24 hours: Temp:  [97.7 F (36.5 C)-99.1 F (37.3 C)] 99.1 F (37.3 C) (10/13 0402) Pulse Rate:  [0-113] 83 (10/13 0700) Resp:  [0-50] 18 (10/13 0800) BP: (89-143)/(49-92) 115/63 (10/13 0800) SpO2:  [0 %-100 %] 100 % (10/13 0700) Arterial Line BP: (111-164)/(42-73) 157/73 (10/13 0800) Weight:  [74 kg] 74 kg (10/12 1152)  Intake/Output from previous day: 10/12 0701 - 10/13 0700 In: 465.2 [I.V.:465.2] Out: -  Intake/Output from this shift: Total I/O In: 360 [P.O.:360] Out: 125 [Urine:125]  Physical Exam: Nursing note and vitals reviewed. Constitutional: She is oriented to person, place, and time. She appears well-developed and well-nourished. No distress.  HENT:  Head: Normocephalic and atraumatic.  Eyes: Pupils are equal, round, and reactive to light. Conjunctivae are normal.  Neck: Normal range of motion. Neck supple. No JVD present.  Cardiovascular: Normal rate, regular rhythm and normal heart sounds.  No murmur heard. Pulses:      Femoral pulses are 2+ on the right side, and 2+ on the left side.      Dorsalis pedis pulses are 1+ on the right side, and 1+ on the left side.       Posterior tibial pulses are 1+ on the right side, and 1+ on the left side.  Respiratory: Effort normal and breath sounds normal. She has no wheezes. She has no rales.  Sternotomy scar  GI: Soft. Bowel sounds are normal. There is no tenderness. There is no rebound.  Musculoskeletal: She exhibits edema (Trace b/l).  Lymphadenopathy:    She has no cervical adenopathy.  Neurological: She is alert and oriented to person, place, and time. No cranial nerve deficit.  Skin: Skin is warm and dry.  Psychiatric: She has a normal mood and  affect.   Lab Results: Recent Labs    02/23/2018 0945 02/25/18 0437  WBC 11.5* 8.6  HGB 13.9 11.6*  PLT 252 177   Recent Labs    03/02/2018 0945 02/25/18 0437  NA 135 136  K 3.8 3.4*  CL 102 104  CO2 23 22  GLUCOSE 409* 176*  BUN 20 15  CREATININE 0.72 0.48   Recent Labs    03/06/2018 1318 02/25/18 0437  TROPONINI 5.22* 10.98*   Hepatic Function Panel Recent Labs    03/11/2018 0945  PROT 5.6*  ALBUMIN 3.4*  AST 49*  ALT 38  ALKPHOS 84  BILITOT 1.2   Recent Labs    02/17/2018 0945  CHOL 152   Cardiac studies:  EKG 02/27/2018 post PCI: Sinus rhythm 100 bpm. Right bundle branch block. Inferior T-wave inversions in lateral ST depressions improved compared to EKG on arrival  Cath 03/06/2018: LM-LCx/OM1: Severe calcific 95-99% disease (Likely chronic, non-culprit) LAD: Ostially occluded, bypassed by SVG-LAD. Mid LAD 99% stenosis LCx: As above.  RCA: Large vessel. Mid 30% stenosis SVG-LAD: Proximal 99% stenosis. (Culprit) Successful PTCA and stent placemnt Synergy DES 4.0 X 8 mm 0% residual stenosis  Hospital echocardiogram 03/06/2018: - Left ventricle: The cavity size was normal. The estimated ejection fraction was in the range of 40% to 45%. Wall motion was normal; there were no regional wall motion abnormalities. The study is not technically sufficient to allow evaluation of LV diastolic function. -  Aortic valve: Moderately calcified annulus. - Inadequate Doppler evaluation of mitral valve. Mild mitral stenosis cannot be excluded. No significant valvular regurgitation.   Assessment: Successful culprit artery revascularization to SVG-LAD Synergy 4.0 x 8 mm drug-eluting stent Residual severe calcific disease LM-LCx stenosis Transient asystole in cath lab requiring CPR and temporary pacemaker Intermittent high grade AV block: Recurrent high grade AV block episodes in spite of successful revascularization is concerning for AV conduction  disease Uncontrolled type 2 DM Possible mild dementia  Plan: Dual antiplatelet therapy with aspirin and Plavix for at least one year Keep temporary pacemaker in place in good position. Will discuss with Dr. Graciela Husbands regarding pacemaker. Hold LM-LCx PCI pending definitive management for AV conduction disease.  Of note, temporary pacemaker had developed kink during placement and may need removal under fluoroscopy.  Continue losartan 25 mg today, hold BB for now.  Lidocaine patch for pain control. Insulin coverage for DM   LOS: 1 day    Manish J Patwardhan 02/25/2018, 11:06 AM  Manish Emiliano Dyer, MD [facility] [place] Pager: [phone] Office: [phone] If no answer Cell [phone]

## 2018-02-25 NOTE — Progress Notes (Signed)
Surgical consent is signed and placed in chart for pacemaker placement Monday morning. Required blood work is in chart and EKG has been ordered for 6 AM as per Dr.  Odessa Fleming orders. 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

## 2018-02-25 NOTE — H&P (View-Only) (Signed)
ELECTROPHYSIOLOGY CONSULT NOTE  Patient ID: Crystal Moran, MRN: [id number], DOB/AGE: Sep 21, 1928 84 y.o. Admit date: 03/07/2018 Date of Consult: 02/25/2018  Primary Physician: Majel Homer, MD Primary Cardiologist: MP Montine Jatasia Gundrum is a 84 y.o. female who is being seen today for the evaluation of intermittent complete heart block at the request of Dr MP.   Chief Complaint: CHB   HPI Crystal Moran is a 84 y.o. female admitted 10/12 with chest pain.  ECGs were apparently demonstrative of ST elevation (not available) she was taken to the Cath Lab >> LM-LCx/OM1: Severe calcific 95-99% disease (Likely   chronic, non-culprit)  LAD: Ostially occluded, bypassed by SVG-LAD. Mid LAD 99% stenosis  LCx: As above.   RCA: Large vessel. Mid 30% stenosis  SVG-LAD: Proximal 99% stenosis. (Culprit)  Successful PTCA and stent placemnt Synergy DES 4.0 X 8 mm  Echo demonstrated EF of 40-45%  No prior syncope except for the birth of her first child decades decades ago  Under a great deal of psychosocial stress related to issues between her children and financials and paperwork   Past Medical History:  Diagnosis Date  . Coronary artery disease involving autologous vein bypass graft   . Hypertension   . Uncontrolled type 2 diabetes mellitus [facility])       Surgical History:  Past Surgical History:  Procedure Laterality Date  . CORONARY ARTERY BYPASS GRAFT     1980s (SVG-LAD)     Home Meds: Prior to Admission medications   Medication Sig Start Date End Date Taking? Authorizing Provider  acetaminophen (TYLENOL) 325 MG tablet Take 650 mg by mouth every 4 (four) hours as needed for mild pain.   Yes [provider]  ALPRAZolam Prudy Feeler) 0.5 MG tablet Take 0.5 mg by mouth 2 (two) times daily.   Yes [provider]  aspirin EC 81 MG tablet Take 81 mg by mouth daily.   Yes [provider]  atorvastatin (LIPITOR) 20 MG tablet Take 20 mg by  mouth daily.   Yes [provider]  diclofenac sodium (VOLTAREN) 1 % GEL Apply 2 g topically 4 (four) times daily.   Yes [provider]  docusate sodium (COLACE) 100 MG capsule Take 200 mg by mouth at bedtime.   Yes [provider]  insulin glargine (LANTUS) 100 UNIT/ML injection Inject 20 Units into the skin every morning.   Yes [provider]  isosorbide mononitrate (IMDUR) 30 MG 24 hr tablet Take 30 mg by mouth every morning.   Yes [provider]  metoprolol succinate (TOPROL-XL) 25 MG 24 hr tablet Take 25 mg by mouth daily.   Yes [provider]  nitroGLYCERIN (NITROSTAT) 0.4 MG SL tablet Place 0.4 mg under the tongue every 5 (five) minutes as needed for chest pain.   Yes [provider]  pantoprazole (PROTONIX) 40 MG tablet Take 40 mg by mouth daily.   Yes [provider]    Inpatient Medications:  . aspirin EC  81 mg Oral Daily  . Chlorhexidine Gluconate Cloth  6 each Topical Q0600  . clopidogrel  75 mg Oral Q breakfast  . docusate sodium  200 mg Oral QHS  . heparin  5,000 Units Subcutaneous Q8H  . insulin aspart  0-15 Units Subcutaneous TID WC  . insulin glargine  20 Units Subcutaneous BH-q7a  . isosorbide mononitrate  30 mg Oral BH-q7a  . lidocaine  1 patch Transdermal Q24H  . losartan  25 mg Oral Daily  .  mouth rinse  15 mL Mouth Rinse BID  . mupirocin ointment  1 application Nasal BID  . pantoprazole  40 mg Oral Daily  . rosuvastatin  20 mg Oral q1800  . sodium chloride flush  3 mL Intravenous Q12H     Allergies:  Allergies  Allergen Reactions  . Salmeterol Shortness Of Breath    Resp.   . Ranitidine Swelling  . Ace Inhibitors     cough  . Amlodipine     Fluid retention  . Amlodipine Besylate Swelling    Retains fluid  . Amoxicillin-Pot Clavulanate Nausea Only    Abd pain   . Clindamycin   . Clopidogrel Nausea And Vomiting  . Clopidogrel Bisulfate Nausea Only  . Desloratadine Tinitus     H/a-face and neck flushing, tinnitus   . Felodipine     Chest pain/arm pain  . Fluticasone-Salmeterol     Resp.   . Iodine Swelling  . Kenalog  [Triamcinolone]     Chest pain   . Other     hayfever  . Pollen Extract   . Singulair  [Montelukast] Swelling    Muscle/bone pain-weight gain-h/a  . Synthroid  [Levothyroxine]     H/a, elevated BP  . Tiotropium     Pain in both arms/chest  . Tiotropium Bromide Monohydrate   . Triamcinolone Acetonide Nausea And Vomiting    Chest pain/nausea  . Fexofenadine Anxiety    H/a-face and neck flushing-tinnitus   . Latex Rash    Social History   Socioeconomic History  . Marital status: Single    Spouse name: Not on file  . Number of children: Not on file  . Years of education: Not on file  . Highest education level: Not on file  Occupational History  . Not on file  Social Needs  . Financial resource strain: Not on file  . Food insecurity:    Worry: Not on file    Inability: Not on file  . Transportation needs:    Medical: Not on file    Non-medical: Not on file  Tobacco Use  . Smoking status: Former Smoker    Packs/day: 2.00    Years: 18.00    Pack years: 36.00    Types: Cigarettes    Start date: 02/25/1961    Last attempt to quit: 02/26/1979    Years since quitting: 39.0  . Smokeless tobacco: Never Used  Substance and Sexual Activity  . Alcohol use: Not Currently  . Drug use: Never  . Sexual activity: Not on file  Lifestyle  . Physical activity:    Days per week: Not on file    Minutes per session: Not on file  . Stress: Not on file  Relationships  . Social connections:    Talks on phone: Not on file    Gets together: Not on file    Attends religious service: Not on file    Active member of club or organization: Not on file    Attends meetings of clubs or organizations: Not on file    Relationship status: Not on file  . Intimate partner violence:    Fear of current or ex partner: Not on file    Emotionally  abused: Not on file    Physically abused: Not on file    Forced sexual activity: Not on file  Other Topics Concern  . Not on file  Social History Narrative   Patient lives in [place], [place] with her son Ruthene Methvin, who is  is also her HCPOA. Patient has another two children, but she is not in good terms with them. Patient is able to perform most ADL's independently. She is able to light cooking for herself. She only goes out occasionally to the church, and to her doctor's  appointments. . She last saw a cardiologist in [place] over two years ago (>before 2017). She is mentally alert, but tends to forget things in "stressful situations", as per her son.      Family History  Problem Relation Age of Onset  . Heart disease Son      ROS:  Please see the history of present illness.     All other systems reviewed and negative.    Physical Exam: Blood pressure (!) 102/42, pulse 94, temperature 99.1 F (37.3 C), temperature source Oral, resp. rate 19, height 5\' 4"  (1.626 m), weight 74 kg, SpO2 97 %. General: Well developed, well nourished female in no acute distress. Head: Normocephalic, atraumatic, sclera non-icteric, no xanthomas, nares are without discharge. EENT: normal Lymph Nodes:  none Back: without scoliosis/kyphosis, no CVA tendersness Neck: Negative for carotid bruits. JVD not elevated. Lungs: Clear bilaterally to auscultation without wheezes, rales, or rhonchi. Breathing is unlabored. Heart: RRR with S1 S2. 2/6 systolic murmur , rubs, or gallops appreciated. Abdomen: Soft, non-tender, non-distended with normoactive bowel sounds. No hepatomegaly. No rebound/guarding. No obvious abdominal masses. Msk:  Strength and tone appear normal for age. Extremities: No clubbing or cyanosis. N0 edema.  Distal pedal pulses are 2+ and equal bilaterally. Skin: Warm and Dry Neuro: Alert and oriented X 3. CN III-XII intact Grossly normal sensory and motor function . Psych:  Responds to  questions appropriately with a normal affect.      Labs: Cardiac Enzymes Recent Labs    03-11-18 0945 2018/03/11 1318 02/25/18 0437  TROPONINI 0.14* 5.22* 10.98*   CBC Lab Results  Component Value Date   WBC 8.6 02/25/2018   HGB 11.6 (L) 02/25/2018   HCT 35.9 (L) 02/25/2018   MCV 90.0 02/25/2018   PLT 177 02/25/2018   PROTIME: Recent Labs    03/11/18 0945  LABPROT 15.1  INR 1.20   Chemistry  Recent Labs  Lab 03-11-2018 0945 02/25/18 0437  NA 135 136  K 3.8 3.4*  CL 102 104  CO2 23 22  BUN 20 15  CREATININE 0.72 0.48  CALCIUM 8.9 8.4*  PROT 5.6*  --   BILITOT 1.2  --   ALKPHOS 84  --   ALT 38  --   AST 49*  --   GLUCOSE 409* 176*   Lipids Lab Results  Component Value Date   CHOL 152 2018-03-11   HDL 67 2018-03-11   LDLCALC 70 03-11-2018   TRIG 74 03/11/2018   BNP No results found for: PROBNP Thyroid Function Tests: No results for input(s): TSH, T4TOTAL, T3FREE, THYROIDAB in the last 72 hours.  Invalid input(s): FREET3    Miscellaneous No results found for: DDIMER  Radiology/Studies:  Dg Chest Port 1 View  Result Date: 02/25/2018 CLINICAL DATA:  Pacemaker displacement. Evaluate transvenous pacer lead. EXAM: PORTABLE CHEST 1 VIEW COMPARISON:  02/11/2018 FINDINGS: Femoral transvenous pacer lead is present. Pacer lead in the region of the right ventricle. Cardiac pads overlying the chest. Chronic elevation of the right hemidiaphragm. Coarse interstitial lung markings particularly at the lung bases appear chronic. No focal airspace disease. Heart and mediastinum are stable. Prior median sternotomy. Trachea is midline. IMPRESSION: 1. Transvenous pacer wire in the right ventricle  region. 2. Prominent interstitial lung markings appear chronic. Difficult to exclude mild edema. Electronically Signed   By: Richarda Overlie M.D.   On: 02/25/2018 08:22    EKG: 7/17  Sinus with QRS d 86 msec 10/19 Sinus with RBBB and LPFB  ( new from 7/17)   Tel recurrent complete  heart block requiring pacing   Assessment and Plan:  Anterior Wall MI 2/2 SVG-LAD lesion  RBBB/LPFB new from 20-17  Intermittent CHB  Ischemic cardiomyopathy 40-45% EF  Dementia mild-likely she says it per her daughter's history  HTN  DM  Patient with new conduction system disease, right bundle left posterior fascicular block and intermittent heart block in the context of anterior wall ischemic injury, MI.  I doubt that she will recover stable conduction and will anticipate CRT-P (if possible-LV lead) implantation tomorrow.      Have reviewed benefits and risks.   Sherryl Manges

## 2018-02-25 NOTE — Plan of Care (Signed)
  Problem: Clinical Measurements: Goal: Ability to maintain clinical measurements within normal limits will improve Outcome: Progressing Goal: Will remain free from infection Outcome: Progressing Goal: Diagnostic test results will improve Outcome: Progressing Goal: Cardiovascular complication will be avoided Outcome: Progressing   Problem: Coping: Goal: Level of anxiety will decrease Outcome: Progressing   Problem: Elimination: Goal: Will not experience complications related to urinary retention Outcome: Progressing   Problem: Safety: Goal: Ability to remain free from injury will improve Outcome: Progressing   Problem: Skin Integrity: Goal: Risk for impaired skin integrity will decrease Outcome: Progressing

## 2018-02-26 ENCOUNTER — Encounter (HOSPITAL_COMMUNITY): Payer: Self-pay | Admitting: Cardiology

## 2018-02-26 ENCOUNTER — Encounter (HOSPITAL_COMMUNITY): Admission: AD | Disposition: E | Payer: Self-pay | Source: Home / Self Care | Attending: Cardiology

## 2018-02-26 DIAGNOSIS — I442 Atrioventricular block, complete: Secondary | ICD-10-CM

## 2018-02-26 HISTORY — PX: PACEMAKER IMPLANT: EP1218

## 2018-02-26 LAB — POCT ACTIVATED CLOTTING TIME
Activated Clotting Time: 279 seconds
Activated Clotting Time: 373 seconds
Activated Clotting Time: 334 seconds
Activated Clotting Time: 136 seconds

## 2018-02-26 LAB — GLUCOSE, CAPILLARY
GLUCOSE-CAPILLARY: 171 mg/dL — AB (ref 70–99)
Glucose-Capillary: 178 mg/dL — ABNORMAL HIGH (ref 70–99)
Glucose-Capillary: 179 mg/dL — ABNORMAL HIGH (ref 70–99)
Glucose-Capillary: 198 mg/dL — ABNORMAL HIGH (ref 70–99)
Glucose-Capillary: 200 mg/dL — ABNORMAL HIGH (ref 70–99)

## 2018-02-26 LAB — POCT I-STAT, CHEM 8
BUN: 21 mg/dL (ref 8–23)
Calcium, Ion: 1.24 mmol/L (ref 1.15–1.40)
Chloride: 101 mmol/L (ref 98–111)
Creatinine, Ser: 0.5 mg/dL (ref 0.44–1.00)
Glucose, Bld: 405 mg/dL — ABNORMAL HIGH (ref 70–99)
HCT: 43 % (ref 36.0–46.0)
Hemoglobin: 14.6 g/dL (ref 12.0–15.0)
Potassium: 3.9 mmol/L (ref 3.5–5.1)
Sodium: 136 mmol/L (ref 135–145)
TCO2: 24 mmol/L (ref 22–32)

## 2018-02-26 SURGERY — PACEMAKER IMPLANT

## 2018-02-26 MED ORDER — CLOPIDOGREL BISULFATE 75 MG PO TABS
75.0000 mg | ORAL_TABLET | Freq: Every day | ORAL | Status: DC
  Administered 2018-02-26 – 2018-02-27 (×2): 75 mg via ORAL
  Filled 2018-02-26 (×3): qty 1

## 2018-02-26 MED ORDER — ORAL CARE MOUTH RINSE
15.0000 mL | Freq: Two times a day (BID) | OROMUCOSAL | Status: DC
  Administered 2018-02-26: 15 mL via OROMUCOSAL

## 2018-02-26 MED ORDER — METHYLPREDNISOLONE SODIUM SUCC 125 MG IJ SOLR
125.0000 mg | Freq: Once | INTRAMUSCULAR | Status: AC
  Administered 2018-02-26: 125 mg via INTRAVENOUS

## 2018-02-26 MED ORDER — LOSARTAN POTASSIUM 25 MG PO TABS
25.0000 mg | ORAL_TABLET | Freq: Every day | ORAL | Status: DC
  Administered 2018-02-26: 25 mg via ORAL
  Filled 2018-02-26: qty 1

## 2018-02-26 MED ORDER — HEPARIN SODIUM (PORCINE) 5000 UNIT/ML IJ SOLN: 5000.0000 [IU] | Freq: Three times a day (TID) | INTRAMUSCULAR | Status: DC

## 2018-02-26 MED ORDER — ONDANSETRON HCL 4 MG/2ML IJ SOLN: 4.0000 mg | Freq: Four times a day (QID) | INTRAMUSCULAR | Status: DC | PRN

## 2018-02-26 MED ORDER — IOPAMIDOL (ISOVUE-370) INJECTION 76%
INTRAVENOUS | Status: AC
  Filled 2018-02-26: qty 50

## 2018-02-26 MED ORDER — LIDOCAINE HCL (PF) 1 % IJ SOLN
INTRAMUSCULAR | Status: AC
  Filled 2018-02-26: qty 60

## 2018-02-26 MED ORDER — ACETAMINOPHEN 325 MG PO TABS
650.0000 mg | ORAL_TABLET | ORAL | Status: DC | PRN
  Administered 2018-02-26: 650 mg via ORAL
  Filled 2018-02-26: qty 2

## 2018-02-26 MED ORDER — SODIUM CHLORIDE 0.9 % IV SOLN
INTRAVENOUS | Status: AC
  Filled 2018-02-26: qty 2

## 2018-02-26 MED ORDER — ACETAMINOPHEN 325 MG PO TABS
325.0000 mg | ORAL_TABLET | ORAL | Status: DC | PRN
  Administered 2018-02-26: 650 mg via ORAL

## 2018-02-26 MED ORDER — HEPARIN (PORCINE) IN NACL 1000-0.9 UT/500ML-% IV SOLN
INTRAVENOUS | Status: AC
  Filled 2018-02-26: qty 500

## 2018-02-26 MED ORDER — SODIUM CHLORIDE 0.9% FLUSH
3.0000 mL | Freq: Two times a day (BID) | INTRAVENOUS | Status: DC
  Administered 2018-02-26 – 2018-02-27 (×2): 3 mL via INTRAVENOUS

## 2018-02-26 MED ORDER — SODIUM CHLORIDE 0.9 % IV SOLN
INTRAVENOUS | Status: AC
  Administered 2018-02-26: 10:00:00 via INTRAVENOUS

## 2018-02-26 MED ORDER — VANCOMYCIN HCL IN DEXTROSE 1-5 GM/200ML-% IV SOLN
INTRAVENOUS | Status: AC
  Filled 2018-02-26: qty 200

## 2018-02-26 MED ORDER — SODIUM CHLORIDE 0.9% FLUSH: 3.0000 mL | INTRAVENOUS | Status: DC | PRN

## 2018-02-26 MED ORDER — DIPHENHYDRAMINE HCL 50 MG/ML IJ SOLN
25.0000 mg | Freq: Once | INTRAMUSCULAR | Status: AC
  Administered 2018-02-26: 25 mg via INTRAVENOUS

## 2018-02-26 MED ORDER — FAMOTIDINE IN NACL 20-0.9 MG/50ML-% IV SOLN
20.0000 mg | Freq: Once | INTRAVENOUS | Status: AC
  Administered 2018-02-26: 20 mg via INTRAVENOUS

## 2018-02-26 MED ORDER — DIPHENHYDRAMINE HCL 50 MG/ML IJ SOLN
INTRAMUSCULAR | Status: AC
  Filled 2018-02-26: qty 1

## 2018-02-26 MED ORDER — VANCOMYCIN HCL IN DEXTROSE 1-5 GM/200ML-% IV SOLN
1000.0000 mg | Freq: Two times a day (BID) | INTRAVENOUS | Status: AC
  Administered 2018-02-26: 1000 mg via INTRAVENOUS
  Filled 2018-02-26: qty 200

## 2018-02-26 MED ORDER — SODIUM CHLORIDE 0.9 % IV SOLN
250.0000 mL | INTRAVENOUS | Status: DC | PRN
  Administered 2018-02-26: 250 mL via INTRAVENOUS

## 2018-02-26 MED ORDER — FAMOTIDINE IN NACL 20-0.9 MG/50ML-% IV SOLN
INTRAVENOUS | Status: AC
  Filled 2018-02-26: qty 50

## 2018-02-26 MED ORDER — ASPIRIN EC 81 MG PO TBEC
81.0000 mg | DELAYED_RELEASE_TABLET | Freq: Every day | ORAL | Status: DC
  Administered 2018-02-26: 81 mg via ORAL
  Filled 2018-02-26: qty 1

## 2018-02-26 MED ORDER — PANTOPRAZOLE SODIUM 40 MG PO TBEC
40.0000 mg | DELAYED_RELEASE_TABLET | Freq: Every day | ORAL | Status: DC
Start: 2018-02-26 — End: 2018-02-27
  Administered 2018-02-26: 40 mg via ORAL
  Filled 2018-02-26: qty 1

## 2018-02-26 MED ORDER — LIDOCAINE HCL (PF) 1 % IJ SOLN
INTRAMUSCULAR | Status: DC | PRN
  Administered 2018-02-26: 60 mL

## 2018-02-26 MED ORDER — ATROPINE SULFATE 1 MG/10ML IJ SOSY
PREFILLED_SYRINGE | INTRAMUSCULAR | Status: AC
  Filled 2018-02-26: qty 10

## 2018-02-26 MED ORDER — METHYLPREDNISOLONE SODIUM SUCC 125 MG IJ SOLR
INTRAMUSCULAR | Status: AC
  Filled 2018-02-26: qty 2

## 2018-02-26 MED ORDER — INSULIN GLARGINE 100 UNIT/ML ~~LOC~~ SOLN
20.0000 [IU] | SUBCUTANEOUS | Status: DC
  Administered 2018-02-26 – 2018-02-27 (×2): 20 [IU] via SUBCUTANEOUS
  Filled 2018-02-26 (×2): qty 0.2

## 2018-02-26 MED ORDER — HEPARIN (PORCINE) IN NACL 1000-0.9 UT/500ML-% IV SOLN
INTRAVENOUS | Status: DC | PRN
  Administered 2018-02-26: 500 mL

## 2018-02-26 SURGICAL SUPPLY — 12 items
CABLE SURGICAL S-101-97-12 (CABLE) ×2 IMPLANT
HEMOSTAT SURGICEL 2X4 FIBR (HEMOSTASIS) ×2 IMPLANT
IPG PACE AZUR XT DR MRI W1DR01 (Pacemaker) ×1 IMPLANT
KIT ESSENTIALS PG (KITS) ×2 IMPLANT
LEAD CAPSURE NOVUS 5076-52CM (Lead) ×2 IMPLANT
LEAD CAPSURE NOVUS 5076-58CM (Lead) ×2 IMPLANT
PACE AZURE XT DR MRI W1DR01 (Pacemaker) ×2 IMPLANT
PAD PRO RADIOLUCENT 2001M-C (PAD) ×2 IMPLANT
SHEATH CLASSIC 7F (SHEATH) ×4 IMPLANT
SHEATH CLASSIC 9.5F (SHEATH) ×2 IMPLANT
TRAY PACEMAKER INSERTION (PACKS) ×2 IMPLANT
WIRE HI TORQ VERSACORE-J 145CM (WIRE) ×2 IMPLANT

## 2018-02-26 NOTE — Discharge Instructions (Signed)
° ° °  Supplemental Discharge Instructions for  Pacemaker/Defibrillator Patients  Activity No heavy lifting or vigorous activity with your left/right arm for 6 to 8 weeks.  Do not raise your left/right arm above your head for one week.  Gradually raise your affected arm as drawn below.           __     03/02/18                   03/03/18                 03/04/18                03/05/18  NO DRIVING until wound check   WOUND CARE - Keep the wound area clean and dry.    - No bandage is needed on the site.  DO  NOT apply any creams, oils, or ointments to the wound area. - If you notice any drainage or discharge from the wound, any swelling or bruising at the site, or you develop a fever > 101? F after you are discharged home, call the office at once.  Special Instructions - You are still able to use cellular telephones; use the ear opposite the side where you have your pacemaker/defibrillator.  Avoid carrying your cellular phone near your device. - When traveling through airports, show security personnel your identification card to avoid being screened in the metal detectors.  Ask the security personnel to use the hand wand. - Avoid arc welding equipment, TENS units (transcutaneous nerve stimulators).  Call the office for questions about other devices. - Avoid electrical appliances that are in poor condition or are not properly grounded. - Microwave ovens are safe to be near or to operate.

## 2018-02-26 NOTE — Progress Notes (Addendum)
Patient arrived from EP lab around 1000. Venous and arterial sheaths in place. ACT checked and was 136. Crystal Meiers, RN pulled sheath with cath lab employee at the bedside supervising. Manual pressure held for 20 minutes. Vital signs stable. Patient being AV paced. Level 0.

## 2018-02-26 NOTE — Progress Notes (Signed)
Premedication for iodine allergy pre-procedure ordered per Dr Graciela Husbands.  Medications flagged as contraindicated per Epic. Pharmacy notified and pharmacist reviewed, stated premedications are acceptable for patient.

## 2018-02-26 NOTE — Progress Notes (Signed)
Inpatient Diabetes Program Recommendations  AACE/ADA: New Consensus Statement on Inpatient Glycemic Control (2015)  Target Ranges:  Prepandial:   less than 140 mg/dL      Peak postprandial:   less than 180 mg/dL (1-2 hours)      Critically ill patients:  140 - 180 mg/dL   Review of Glycemic Control  Diabetes history: DM 2 Outpatient Diabetes medications: Lantus 20-25 units qam, Novolog 5 units if glucose is elevated.  Current orders for Inpatient glycemic control: Lantus 20 units, Novolog 0-15 units tid  A1c 12.3% this admission  Spoke with patient and son at bedside about diabetes and home regimen for diabetes control. Patient and son reports giving patient 20 units of lantu in the morning and when glucose gets into the 300's he gives her 5 units of Novolog. Son reports patient lives with him and he has just started helping her with her medications due to memory issues. Son reports he thinks his mom missed doses. Right now he has her fasting glucose coming down into the 180-low 200 range. Son has DM himself and is at an A1c of 7%. Patient is followed closely at this time by PCP and changes were made 2 weeks ago. Encouraged follow up.  Explained how hyperglycemia leads to damage within blood vessels which lead to the common complications seen with uncontrolled diabetes. Stressed to the patient the importance of improving glycemic control to prevent further complications from uncontrolled diabetes. Discussed impact of nutrition, exercise, stress, sickness, and medications on diabetes control.Patient and son verbalized understanding and have no further questions at this time related to diabetes.   Thanks,  Christena Deem RN, MSN, BC-ADM Inpatient Diabetes Coordinator Team Pager [phone] (8a-5p)

## 2018-02-26 NOTE — Progress Notes (Signed)
Sheath pulled at 1058 with Cath lab. Held pressure for 20 minutes. No hematoma, level 0.

## 2018-02-26 NOTE — Progress Notes (Signed)
Subjective:  S/p pacemaker today.   Objective:  Vital Signs in the last 24 hours: Temp:  [97.5 F (36.4 C)-99 F (37.2 C)] 97.5 F (36.4 C) (10/14 1959) Pulse Rate:  [0-112] 84 (10/14 1900) Resp:  [0-41] 23 (10/14 1900) BP: (86-141)/(48-86) 108/57 (10/14 1900) SpO2:  [0 %-100 %] 95 % (10/14 1900) Arterial Line BP: (104-144)/(49-63) 129/57 (10/14 0700)  Intake/Output from previous day: 10/13 0701 - 10/14 0700 In: 1080 [P.O.:1080] Out: 203 [Urine:201; Stool:2] Intake/Output from this shift: No intake/output data recorded.  Physical Exam: Nursing note and vitals reviewed. Constitutional: She is oriented to person, place, and time. She appears well-developed and well-nourished. No distress.  HENT:  Head: Normocephalic and atraumatic.  Eyes: Pupils are equal, round, and reactive to light. Conjunctivae are normal.  Neck: Normal range of motion. Neck supple. No JVD present.  Cardiovascular: Normal rate, regular rhythm and normal heart sounds.  No murmur heard. Pulses:      Femoral pulses are 2+ on the right side, and 2+ on the left side.      Dorsalis pedis pulses are 1+ on the right side, and 1+ on the left side.       Posterior tibial pulses are 1+ on the right side, and 1+ on the left side.  Respiratory: Effort normal and breath sounds normal. She has no wheezes. She has no rales.  Sternotomy scar. Pacemaker pocket with no hematoma GI: Soft. Bowel sounds are normal. There is no tenderness. There is no rebound.  Musculoskeletal: She exhibits edema (Trace b/l).  Lymphadenopathy:    She has no cervical adenopathy.  Neurological: She is alert and oriented to person, place, and time. No cranial nerve deficit.  Skin: Skin is warm and dry.  Psychiatric: She has a normal mood and affect.   Lab Results: Recent Labs    02/16/2018 0945 02/15/2018 0950 02/25/18 0437  WBC 11.5*  --  8.6  HGB 13.9 14.6 11.6*  PLT 252  --  177   Recent Labs    02/22/2018 0945 03/12/2018 0950  02/25/18 0437  NA 135 136 136  K 3.8 3.9 3.4*  CL 102 101 104  CO2 23  --  22  GLUCOSE 409* 405* 176*  BUN 20 21 15   CREATININE 0.72 0.50 0.48   Recent Labs    03/04/2018 1318 02/25/18 0437  TROPONINI 5.22* 10.98*   Hepatic Function Panel Recent Labs    03/08/2018 0945  PROT 5.6*  ALBUMIN 3.4*  AST 49*  ALT 38  ALKPHOS 84  BILITOT 1.2   Recent Labs    03/15/2018 0945  CHOL 152   Cardiac studies:  EKG 02/13/2018 post PCI: Sinus rhythm 100 bpm. Right bundle branch block. Inferior T-wave inversions in lateral ST depressions improved compared to EKG on arrival  Cath 03/05/2018: LM-LCx/OM1: Severe calcific 95-99% disease (Likely chronic, non-culprit) LAD: Ostially occluded, bypassed by SVG-LAD. Mid LAD 99% stenosis LCx: As above.  RCA: Large vessel. Mid 30% stenosis SVG-LAD: Proximal 99% stenosis. (Culprit) Successful PTCA and stent placemnt Synergy DES 4.0 X 8 mm 0% residual stenosis  Hospital echocardiogram 03/08/2018: - Left ventricle: The cavity size was normal. The estimated ejection fraction was in the range of 40% to 45%. Wall motion was normal; there were no regional wall motion abnormalities. The study is not technically sufficient to allow evaluation of LV diastolic function. - Aortic valve: Moderately calcified annulus. - Inadequate Doppler evaluation of mitral valve. Mild mitral stenosis cannot be excluded. No significant valvular regurgitation.  Assessment: CAD: STEMI. Successful culprit artery revascularization to SVG-LAD Synergy 4.0 x 8 mm drug-eluting stent.  Dual antiplatelet therapy with aspirin and Plavix for at least one year Residual severe calcific disease LM-LCx stenosis. Will plan staged revascularization in near future.  S/p PPM due to intermittent thir degree AV block Uncontrolled type 2 DM Continue losartan 25 mg today, Start beta blocker tomorrow. Lidocaine patch for pain control. Insulin coverage for DM   LOS: 2  days    Manish J Patwardhan March 06, 2018, 9:56 PM  Manish Emiliano Dyer, MD [facility] [place] Pager: [phone] Office: [phone] If no answer Cell [phone]

## 2018-02-26 NOTE — Interval H&P Note (Signed)
History and Physical Interval Note:  03/12/2018 7:19 AM  Crystal Moran  has presented today for surgery, with the diagnosis of hb  The various methods of treatment have been discussed with the patient and family. After consideration of risks, benefits and other options for treatment, the patient has consented to  Procedure(s): PACEMAKER IMPLANT (N/A) as a surgical intervention .  The patient's history has been reviewed, patient examined, no change in status, stable for surgery.  I have reviewed the patient's chart and labs.  Questions were answered to the patient's satisfaction.     Sherryl Manges  Complete heart block this am Will proceed with pacing

## 2018-02-27 ENCOUNTER — Encounter (HOSPITAL_COMMUNITY): Admission: AD | Disposition: E | Payer: Self-pay | Source: Home / Self Care | Attending: Cardiology

## 2018-02-27 ENCOUNTER — Inpatient Hospital Stay (HOSPITAL_COMMUNITY): Payer: Medicare Other

## 2018-02-27 ENCOUNTER — Encounter (HOSPITAL_COMMUNITY): Payer: Self-pay | Admitting: Internal Medicine

## 2018-02-27 DIAGNOSIS — G9340 Encephalopathy, unspecified: Secondary | ICD-10-CM

## 2018-02-27 DIAGNOSIS — J9601 Acute respiratory failure with hypoxia: Secondary | ICD-10-CM

## 2018-02-27 DIAGNOSIS — R579 Shock, unspecified: Secondary | ICD-10-CM

## 2018-02-27 DIAGNOSIS — J189 Pneumonia, unspecified organism: Secondary | ICD-10-CM

## 2018-02-27 DIAGNOSIS — Z515 Encounter for palliative care: Secondary | ICD-10-CM

## 2018-02-27 DIAGNOSIS — Z7189 Other specified counseling: Secondary | ICD-10-CM

## 2018-02-27 LAB — POCT I-STAT 3, ART BLOOD GAS (G3+)
Acid-base deficit: 6 mmol/L — ABNORMAL HIGH (ref 0.0–2.0)
Bicarbonate: 20.3 mmol/L (ref 20.0–28.0)
O2 SAT: 100 %
PCO2 ART: 44.2 mmHg (ref 32.0–48.0)
PH ART: 7.27 — AB (ref 7.350–7.450)
TCO2: 22 mmol/L (ref 22–32)
pO2, Arterial: 242 mmHg — ABNORMAL HIGH (ref 83.0–108.0)

## 2018-02-27 LAB — RENAL FUNCTION PANEL
ANION GAP: 20 — AB (ref 5–15)
Albumin: 2.6 g/dL — ABNORMAL LOW (ref 3.5–5.0)
BUN: 31 mg/dL — AB (ref 8–23)
CHLORIDE: 100 mmol/L (ref 98–111)
CO2: 16 mmol/L — ABNORMAL LOW (ref 22–32)
Calcium: 8.1 mg/dL — ABNORMAL LOW (ref 8.9–10.3)
Creatinine, Ser: 1.75 mg/dL — ABNORMAL HIGH (ref 0.44–1.00)
GFR calc non Af Amer: 25 mL/min — ABNORMAL LOW (ref >60)
GFR, EST AFRICAN AMERICAN: 29 mL/min — AB (ref >60)
Glucose, Bld: 443 mg/dL — ABNORMAL HIGH (ref 70–99)
POTASSIUM: 4.9 mmol/L (ref 3.5–5.1)
Phosphorus: 7.8 mg/dL — ABNORMAL HIGH (ref 2.5–4.6)
Sodium: 136 mmol/L (ref 135–145)

## 2018-02-27 LAB — LACTIC ACID, PLASMA
Lactic Acid, Venous: 9.4 mmol/L (ref 0.5–1.9)
Lactic Acid, Venous: 9.3 mmol/L (ref 0.5–1.9)

## 2018-02-27 LAB — GLUCOSE, CAPILLARY
GLUCOSE-CAPILLARY: 400 mg/dL — AB (ref 70–99)
GLUCOSE-CAPILLARY: 319 mg/dL — AB (ref 70–99)
GLUCOSE-CAPILLARY: 285 mg/dL — AB (ref 70–99)
GLUCOSE-CAPILLARY: 176 mg/dL — AB (ref 70–99)
GLUCOSE-CAPILLARY: 66 mg/dL — AB (ref 70–99)
Glucose-Capillary: 363 mg/dL — ABNORMAL HIGH (ref 70–99)
Glucose-Capillary: 344 mg/dL — ABNORMAL HIGH (ref 70–99)
Glucose-Capillary: 252 mg/dL — ABNORMAL HIGH (ref 70–99)
Glucose-Capillary: 148 mg/dL — ABNORMAL HIGH (ref 70–99)

## 2018-02-27 LAB — CBC
HEMATOCRIT: 37.2 % (ref 36.0–46.0)
HEMOGLOBIN: 11.6 g/dL — AB (ref 12.0–15.0)
MCH: 29.3 pg (ref 26.0–34.0)
MCHC: 31.2 g/dL (ref 30.0–36.0)
MCV: 93.9 fL (ref 80.0–100.0)
Platelets: 149 10*3/uL — ABNORMAL LOW (ref 150–400)
RBC: 3.96 MIL/uL (ref 3.87–5.11)
RDW: 13.8 % (ref 11.5–15.5)
WBC: 14.3 10*3/uL — ABNORMAL HIGH (ref 4.0–10.5)
nRBC: 0 % (ref 0.0–0.2)

## 2018-02-27 LAB — CORTISOL: Cortisol, Plasma: 66.3 ug/dL

## 2018-02-27 LAB — POCT I-STAT, CHEM 8
BUN: 39 mg/dL — ABNORMAL HIGH (ref 8–23)
CALCIUM ION: 1.06 mmol/L — AB (ref 1.15–1.40)
Chloride: 99 mmol/L (ref 98–111)
Creatinine, Ser: 1.5 mg/dL — ABNORMAL HIGH (ref 0.44–1.00)
GLUCOSE: 432 mg/dL — AB (ref 70–99)
HCT: 35 % — ABNORMAL LOW (ref 36.0–46.0)
HEMOGLOBIN: 11.9 g/dL — AB (ref 12.0–15.0)
POTASSIUM: 4.9 mmol/L (ref 3.5–5.1)
Sodium: 133 mmol/L — ABNORMAL LOW (ref 135–145)
TCO2: 21 mmol/L — ABNORMAL LOW (ref 22–32)

## 2018-02-27 LAB — MAGNESIUM: Magnesium: 2.1 mg/dL (ref 1.7–2.4)

## 2018-02-27 LAB — HEPATIC FUNCTION PANEL
ALBUMIN: 2.6 g/dL — AB (ref 3.5–5.0)
ALK PHOS: 92 U/L (ref 38–126)
ALT: 190 U/L — AB (ref 0–44)
AST: 399 U/L — AB (ref 15–41)
Bilirubin, Direct: 0.9 mg/dL — ABNORMAL HIGH (ref 0.0–0.2)
Indirect Bilirubin: 1.2 mg/dL — ABNORMAL HIGH (ref 0.3–0.9)
TOTAL PROTEIN: 4.8 g/dL — AB (ref 6.5–8.1)
Total Bilirubin: 2.1 mg/dL — ABNORMAL HIGH (ref 0.3–1.2)

## 2018-02-27 LAB — BASIC METABOLIC PANEL
ANION GAP: 18 — AB (ref 5–15)
BUN: 30 mg/dL — ABNORMAL HIGH (ref 8–23)
CALCIUM: 8.8 mg/dL — AB (ref 8.9–10.3)
CHLORIDE: 97 mmol/L — AB (ref 98–111)
CO2: 18 mmol/L — AB (ref 22–32)
CREATININE: 1.75 mg/dL — AB (ref 0.44–1.00)
GFR calc Af Amer: 29 mL/min — ABNORMAL LOW (ref >60)
GFR calc non Af Amer: 25 mL/min — ABNORMAL LOW (ref >60)
GLUCOSE: 429 mg/dL — AB (ref 70–99)
Potassium: 4.2 mmol/L (ref 3.5–5.1)
Sodium: 133 mmol/L — ABNORMAL LOW (ref 135–145)

## 2018-02-27 LAB — COOXEMETRY PANEL
CARBOXYHEMOGLOBIN: 1.2 % (ref 0.5–1.5)
Methemoglobin: 1.4 % (ref 0.0–1.5)
O2 SAT: 75.4 %
TOTAL HEMOGLOBIN: 11.8 g/dL — AB (ref 12.0–16.0)

## 2018-02-27 LAB — PROCALCITONIN: Procalcitonin: 0.25 ng/mL

## 2018-02-27 LAB — TROPONIN I: Troponin I: >65 ng/mL (ref <0.03)

## 2018-02-27 LAB — PROTIME-INR
INR: 1.64
Prothrombin Time: 19.2 seconds — ABNORMAL HIGH (ref 11.4–15.2)

## 2018-02-27 SURGERY — CORONARY ATHERECTOMY
Anesthesia: LOCAL

## 2018-02-27 MED ORDER — PHENYLEPHRINE HCL-NACL 10-0.9 MG/250ML-% IV SOLN
0.0000 ug/min | INTRAVENOUS | Status: DC
  Filled 2018-02-27: qty 250

## 2018-02-27 MED ORDER — HYDROCORTISONE NA SUCCINATE PF 100 MG IJ SOLR
50.0000 mg | Freq: Four times a day (QID) | INTRAMUSCULAR | Status: DC
  Administered 2018-02-27 – 2018-02-28 (×4): 50 mg via INTRAVENOUS
  Filled 2018-02-27 (×4): qty 2

## 2018-02-27 MED ORDER — CEFTAZIDIME 1 G IJ SOLR
1.0000 g | INTRAMUSCULAR | Status: DC
  Filled 2018-02-27 (×2): qty 1

## 2018-02-27 MED ORDER — VANCOMYCIN HCL 10 G IV SOLR
1500.0000 mg | Freq: Once | INTRAVENOUS | Status: AC
  Administered 2018-02-27: 1500 mg via INTRAVENOUS
  Filled 2018-02-27: qty 1500

## 2018-02-27 MED ORDER — FENTANYL CITRATE (PF) 100 MCG/2ML IJ SOLN
25.0000 ug | INTRAMUSCULAR | Status: DC | PRN
  Administered 2018-02-27: 50 ug via INTRAVENOUS
  Administered 2018-02-28: 25 ug via INTRAVENOUS
  Administered 2018-02-28 (×2): 50 ug via INTRAVENOUS
  Filled 2018-02-27: qty 2

## 2018-02-27 MED ORDER — NOREPINEPHRINE 16 MG/250ML-% IV SOLN
0.0000 ug/min | INTRAVENOUS | Status: AC
  Administered 2018-02-27: 38 ug/min via INTRAVENOUS
  Filled 2018-02-27 (×3): qty 250

## 2018-02-27 MED ORDER — FENTANYL 2500MCG IN NS 250ML (10MCG/ML) PREMIX INFUSION
0.0000 ug/h | INTRAVENOUS | Status: DC
  Administered 2018-02-27: 25 ug/h via INTRAVENOUS
  Administered 2018-02-28: 200 ug/h via INTRAVENOUS
  Filled 2018-02-27 (×2): qty 250

## 2018-02-27 MED ORDER — ASPIRIN 81 MG PO CHEW
81.0000 mg | CHEWABLE_TABLET | Freq: Every day | ORAL | Status: DC
  Administered 2018-02-27: 81 mg via ORAL
  Filled 2018-02-27: qty 1

## 2018-02-27 MED ORDER — VANCOMYCIN HCL IN DEXTROSE 750-5 MG/150ML-% IV SOLN
750.0000 mg | INTRAVENOUS | Status: DC
  Filled 2018-02-27: qty 150

## 2018-02-27 MED ORDER — CHLORHEXIDINE GLUCONATE 0.12% ORAL RINSE (MEDLINE KIT)
15.0000 mL | Freq: Two times a day (BID) | OROMUCOSAL | Status: DC
  Administered 2018-02-27: 15 mL via OROMUCOSAL

## 2018-02-27 MED ORDER — MIDAZOLAM HCL 2 MG/2ML IJ SOLN
1.0000 mg | INTRAMUSCULAR | Status: DC | PRN
  Administered 2018-02-27 – 2018-02-28 (×5): 1 mg via INTRAVENOUS
  Filled 2018-02-27 (×3): qty 2

## 2018-02-27 MED ORDER — FAMOTIDINE IN NACL 20-0.9 MG/50ML-% IV SOLN: 20.0000 mg | INTRAVENOUS | Status: DC

## 2018-02-27 MED ORDER — MIDAZOLAM HCL 2 MG/2ML IJ SOLN
1.0000 mg | INTRAMUSCULAR | Status: DC | PRN
  Filled 2018-02-27 (×3): qty 2

## 2018-02-27 MED ORDER — AMIODARONE IV BOLUS ONLY 150 MG/100ML: 150.0000 mg | Freq: Once | INTRAVENOUS | Status: DC

## 2018-02-27 MED ORDER — SODIUM CHLORIDE 0.9 % IV BOLUS
500.0000 mL | Freq: Once | INTRAVENOUS | Status: AC
  Administered 2018-02-27: 500 mL via INTRAVENOUS

## 2018-02-27 MED ORDER — FENTANYL CITRATE (PF) 100 MCG/2ML IJ SOLN
INTRAMUSCULAR | Status: AC
  Administered 2018-02-27: 100 ug
  Filled 2018-02-27: qty 2

## 2018-02-27 MED ORDER — ROSUVASTATIN CALCIUM 20 MG PO TABS
20.0000 mg | ORAL_TABLET | Freq: Every day | ORAL | Status: DC
  Administered 2018-02-27: 20 mg
  Filled 2018-02-27: qty 1

## 2018-02-27 MED ORDER — SODIUM CHLORIDE 0.9 % IV BOLUS: 250.0000 mL | Freq: Once | INTRAVENOUS | Status: DC

## 2018-02-27 MED ORDER — INSULIN REGULAR(HUMAN) IN NACL 100-0.9 UT/100ML-% IV SOLN
INTRAVENOUS | Status: DC
  Administered 2018-02-27: 4.4 [IU]/h via INTRAVENOUS
  Administered 2018-02-27: 3.7 [IU] via INTRAVENOUS
  Filled 2018-02-27 (×2): qty 100

## 2018-02-27 MED ORDER — MIDAZOLAM HCL 2 MG/2ML IJ SOLN
INTRAMUSCULAR | Status: AC
  Administered 2018-02-27: 2 mg
  Filled 2018-02-27: qty 2

## 2018-02-27 MED ORDER — SODIUM BICARBONATE 8.4 % IV SOLN
INTRAVENOUS | Status: AC
  Filled 2018-02-27: qty 50

## 2018-02-27 MED ORDER — PANTOPRAZOLE SODIUM 40 MG PO PACK
40.0000 mg | PACK | Freq: Every day | ORAL | Status: DC
  Administered 2018-02-27: 40 mg
  Filled 2018-02-27: qty 20

## 2018-02-27 MED ORDER — SODIUM CHLORIDE 0.9 % IV SOLN
1.0000 g | INTRAVENOUS | Status: DC
  Administered 2018-02-27: 1 g via INTRAVENOUS
  Filled 2018-02-27 (×2): qty 1

## 2018-02-27 MED ORDER — NOREPINEPHRINE 16 MG/250ML-% IV SOLN
0.0000 ug/min | INTRAVENOUS | Status: DC
  Administered 2018-02-27: 23 ug/min via INTRAVENOUS
  Administered 2018-02-28: 40 ug/min via INTRAVENOUS
  Filled 2018-02-27 (×2): qty 250

## 2018-02-27 MED ORDER — SODIUM BICARBONATE 8.4 % IV SOLN
50.0000 meq | Freq: Once | INTRAVENOUS | Status: AC
  Administered 2018-02-27: 50 meq via INTRAVENOUS

## 2018-02-27 MED ORDER — DEXTROSE 50 % IV SOLN
14.0000 mL | Freq: Once | INTRAVENOUS | Status: AC
  Administered 2018-02-27 (×2): 14 mL via INTRAVENOUS

## 2018-02-27 MED ORDER — ROCURONIUM BROMIDE 50 MG/5ML IV SOLN
50.0000 mg | Freq: Once | INTRAVENOUS | Status: AC
  Administered 2018-02-27: 50 mg via INTRAVENOUS

## 2018-02-27 MED ORDER — AMIODARONE IV BOLUS ONLY 150 MG/100ML
150.0000 mg | Freq: Once | INTRAVENOUS | Status: AC
  Administered 2018-02-27: 150 mg via INTRAVENOUS

## 2018-02-27 MED ORDER — SODIUM BICARBONATE 8.4 % IV SOLN
INTRAVENOUS | Status: AC
  Administered 2018-02-27: 50 meq
  Filled 2018-02-27: qty 50

## 2018-02-27 MED ORDER — DOCUSATE SODIUM 50 MG/5ML PO LIQD
200.0000 mg | Freq: Every day | ORAL | Status: DC
  Administered 2018-02-27: 200 mg via ORAL
  Filled 2018-02-27: qty 20

## 2018-02-27 MED ORDER — NOREPINEPHRINE 4 MG/250ML-% IV SOLN
0.0000 ug/min | INTRAVENOUS | Status: DC
  Administered 2018-02-27: 20 ug/min via INTRAVENOUS
  Filled 2018-02-27: qty 250

## 2018-02-27 MED ORDER — ORAL CARE MOUTH RINSE
15.0000 mL | OROMUCOSAL | Status: DC
  Administered 2018-02-27 – 2018-02-28 (×7): 15 mL via OROMUCOSAL

## 2018-02-27 MED ORDER — METOPROLOL SUCCINATE ER 25 MG PO TB24: 25.0000 mg | ORAL_TABLET | Freq: Every day | ORAL | Status: DC

## 2018-02-27 MED ORDER — ETOMIDATE 2 MG/ML IV SOLN
20.0000 mg | Freq: Once | INTRAVENOUS | Status: AC
  Administered 2018-02-27: 20 mg via INTRAVENOUS

## 2018-02-27 NOTE — Clinical Social Work Note (Addendum)
CSW was consulted for SNF placement. CSW spoke with pt's son via phone this AM. Pt's medical stability has declined over night. Pt's son is in route to the hospital to meet with the MD to determine GOC. CSW offered support--pt's son was appreciative. Pt's son denies any questions or concerns at this time. CSW will continue to follow in the case that SNF if pt improves.  [place], [place] [phone]

## 2018-02-27 NOTE — Progress Notes (Signed)
Per Rosemary Holms, MD and patient's son's discussion, patient is full DNR with the exception that she is already intubated. We will keep the tube for now. Levophed to stay where it is and to not increase. No escalation of care. Fentanyl gtt started to keep patient comfortable.

## 2018-02-27 NOTE — Progress Notes (Signed)
At beginning of this RN's shift, patient was disoriented. Patwardhan, MD to bedside for morning rounds and patient's BP did not read appropriately due to patient not keeping arm straight. Patient stable at that point. Once Patwardhan, MD left and this RN and Madison, RN were able to do an assessment and calm patient down, BP read 70s/50s. Rosemary Holms, MD paged. Orders for 250 mL bolus of NS x 2 given. Boluses given with no rise in BP. Patient getting gradually more and more lethargic. Rosemary Holms, MD paged again. Ordered to consult CCM. Molli Knock, MD and Lodema Hong, NP to the bedside. 1 amp of bicarb given. Emergent intubation, central line, and arterial line placed. RSI meds given including 100 of fentanyl, 2 of versed, 20 of etomidate, and 50 of rocuronium. Patient's BP continued to decrease. Levophed gtt initiated and titrated up to 60. Patient in episode of VT and bolused with 150 of amiodarone. Another amp of bicarb given. At end of procedures, patient back being AV paced and BP relatively stable on 60 of levophed. Bag and tubing changed and switched from peripheral IV to central line. Labs collected. See results in chart. Patwardhan, MD to bedside and discussed code status with family. Patient is now no CPR.  Critical lactic acid and critical troponin reported to both Molli Knock, MD and Rosemary Holms, MD.

## 2018-02-27 NOTE — Progress Notes (Addendum)
Subjective:  Intubated, sedated  Prior to intubation, patient was disoriented to place, but comfortable and did not complain of any chest pain.  Events of this morning include fluid refractory hypotension, followed by poor mentation and inability to maintain airway. This led to intubation and use two vasopressors. Patient also had episode of sustained ventricular tachycardia requiring 150 mg amiodarone.   Objective:  Vital Signs in the last 24 hours: Temp:  [97.5 F (36.4 C)-99 F (37.2 C)] 99 F (37.2 C) (10/15 0010) Pulse Rate:  [61-98] 69 (10/15 0930) Resp:  [12-30] 20 (10/15 1000) BP: (58-134)/(45-83) 58/45 (10/15 1000) SpO2:  [94 %-100 %] 100 % (10/15 0930) FiO2 (%):  [60 %] 60 % (10/15 1131)  Intake/Output from previous day: 10/14 0701 - 10/15 0700 In: 282.6 [I.V.:282.6] Out: 200 [Urine:200] Intake/Output from this shift: Total I/O In: 260.7 [I.V.:260.7] Out: -   Physical Exam: Nursing noteand vitalsreviewed. Constitutional: Intubated, sedated HENT:  Head:Normocephalicand atraumatic.  Eyes:Pupils are equal, round, and reactive to light.Conjunctivaeare normal.  Neck:Normal range of motion.Neck supple.No JVDpresent.  Cardiovascular:Normal rate,regular rhythmand normal heart sounds. No murmurheard. Pulses: Femoral pulses are 2+on the right side, and 2+on the left side. Dorsalis pedis pulses are 1+on the right side, and 1+on the left side.  Posterior tibial pulses are 1+on the right side, and 1+on the left side.  Respiratory:B/l coarse breath sounds Sternotomy scar WJ:XBJY.Bowel sounds are normal. There isno tenderness. There isno rebound.  Musculoskeletal: She exhibitsedema(Trace b/l).  Lymphadenopathy:  She has no cervical adenopathy.  Neurological: Intubated, sedated.  Skin: Skin iswarmand dry.  Psychiatric: Did not assess  Lab Results: Recent Labs    02/25/18 0437 03/08/2018 1124 02/22/2018 1130  WBC 8.6  --   14.3*  HGB 11.6* 11.9* 11.6*  PLT 177  --  149*   Recent Labs    02/23/2018 0753 03/08/2018 1124 03/13/2018 1131  NA 133* 133* 136  K 4.2 4.9 4.9  CL 97* 99 100  CO2 18*  --  16*  GLUCOSE 429* 432* 443*  BUN 30* 39* 31*  CREATININE 1.75* 1.50* 1.75*   Recent Labs    02/25/18 0437 03/08/2018 1130  TROPONINI 10.98* >65.00*   Hepatic Function Panel Recent Labs    03/12/2018 1130 03/08/2018 1131  PROT 4.8*  --   ALBUMIN 2.6* 2.6*  AST 399*  --   ALT 190*  --   ALKPHOS 92  --   BILITOT 2.1*  --   BILIDIR 0.9*  --   IBILI 1.2*  --    Cardiac studies:  EKG 03/03/2018 post PCI: Sinus rhythm 100 bpm. Right bundle branch block. Inferior T-wave inversions in lateral ST depressions improved compared to EKG on arrival  Cath 03/04/2018: LM-LCx/OM1: Severe calcific 95-99% disease (Likely chronic, non-culprit) LAD: Ostially occluded, bypassed by SVG-LAD. Mid LAD 99% stenosis LCx: As above.  RCA: Large vessel. Mid 30% stenosis SVG-LAD: Proximal 99% stenosis. (Culprit) Successful PTCA and stent placemnt Synergy DES 4.0 X 8 mm 0% residual stenosis  Hospital echocardiogram 03/07/2018: - Left ventricle: The cavity size was normal. The estimated ejection fraction was in the range of 40% to 45%. Wall motion was normal; there were no regional wall motion abnormalities. The study is not technically sufficient to allow evaluation of LV diastolic function. - Aortic valve: Moderately calcified annulus. - Inadequate Doppler evaluation of mitral valve. Mild mitral stenosis cannot be excluded. No significant valvular regurgitation.  Assessment/Plan: Severe shock: Rapid deterioration in hemodynamics this monring. Lactic acid 9. Troponin >65. Differentials  include another acute coronary event leading to cardiogenic shock, or septic shock leading to profound ischemia. I appreciate input from critical care team. She is critically ill with guarded prognosis at this time. Her morality  risk is high and unlikely to change in spite of aggressive measures- including, but not limited to coronary angiogram, circulatory support, escalation of vasopressor therapy.  I had a long conversation with patient's son and HCPOA Alyssah Algeo regarding patient's current condition, prognosis, and options for further care. Given patient's advanced age, dementia, he prefers conservative approach with no escalation of therapy, and no further resuscitation attempts. We will continue the current course of vasopressors and mechanical ventilation for now. Continue DAPT, heparin. Continue antibiotics as per CCM recommendations.   Successful culprit artery revascularization to SVG-LAD Synergy 4.0 x 8 mm drug-eluting stent 10/12 Residual severe calcific disease LM-LCx stenosis Successful dual chamber PPM Mar 08, 2018 due to intermittent complete heart block in spite of successful revascularization. Uncontrolled type 2 DM Possible mild dementia   LOS: 3 days    Zyrus Hetland J Makaveli Hoard 02/15/2018, 1:11 PM  Rivaan Kendall Emiliano Dyer, MD [facility] Pager: [phone] Office: [phone] If no answer Cell [phone]

## 2018-02-27 NOTE — Progress Notes (Signed)
Pharmacy Antibiotic Note  Crystal Moran is a 84 y.o. female admitted on 03/09/2018 with STEMI. On 10/15, she became hypotensive and developed AMS leading to intubation and requiring pressors. Pharmacy has been consulted for vancomycin and ceftazidime dosing for sepsis.  Plan: - Vancomycin 1500 mg IV x1, then vancomycin 750 mg IV q24h. Goal trough 15-20 mcg/mL - Ceftazidime 1 g IV q24h - Monitor renal function, clinical improvement, cultures  Height: 5\' 4"  (162.6 cm) Weight: 163 lb 2.3 oz (74 kg) IBW/kg (Calculated) : 54.7  Temp (24hrs), Avg:98.1 F (36.7 C), Min:97.5 F (36.4 C), Max:99 F (37.2 C)  Recent Labs  Lab 03/07/2018 0945 03/14/2018 0950 02/25/18 0437 03/05/18 0753  WBC 11.5*  --  8.6  --   CREATININE 0.72 0.50 0.48 1.75*    Estimated Creatinine Clearance: 22.3 mL/min (A) (by C-G formula based on SCr of 1.75 mg/dL (H)).    Allergies  Allergen Reactions  . Salmeterol Shortness Of Breath    Resp.   . Ranitidine Swelling  . Ace Inhibitors     cough  . Amlodipine Besylate Swelling    Retains fluid  . Amoxicillin-Pot Clavulanate Nausea Only    Abd pain   . Clindamycin   . Clopidogrel Bisulfate Nausea Only  . Desloratadine Tinitus    H/a-face and neck flushing, tinnitus   . Felodipine     Chest pain/arm pain  . Fluticasone-Salmeterol     Resp.   . Iodine Swelling  . Kenalog  [Triamcinolone]     Chest pain   . Other     hayfever  . Pollen Extract   . Singulair  [Montelukast] Swelling    Muscle/bone pain-weight gain-h/a  . Synthroid  [Levothyroxine]     H/a, elevated BP  . Tiotropium     Pain in both arms/chest  . Tiotropium Bromide Monohydrate   . Triamcinolone Acetonide Nausea And Vomiting    Chest pain/nausea  . Fexofenadine Anxiety    H/a-face and neck flushing-tinnitus   . Latex Rash    Antimicrobials this admission: 10/15 Vancomycin >>  10/15 Ceftazidime >>   Dose adjustments this admission: n/a  Microbiology  results: 10/15 BCx:  10/15 UCx:  10/15 Sputum:  10/12 MRSA PCR: positive  Thank you for allowing pharmacy to be a part of this patient's care.  Arvilla Market, PharmD PGY1 Pharmacy Resident Phone [phone] 03-05-2018     11:23 AM

## 2018-02-27 NOTE — Procedures (Signed)
Intubation Procedure Note Nazaria William Laske [id number] Oct 14, 1928  Procedure: Intubation Indications: Airway protection and maintenance  Procedure Details Consent: Risks of procedure as well as the alternatives and risks of each were explained to the (patient/caregiver).  Consent for procedure obtained. Time Out: Verified patient identification, verified procedure, site/side was marked, verified correct patient position, special equipment/implants available, medications/allergies/relevent history reviewed, required imaging and test results available.  Performed  Maximum sterile technique was used including gloves, hand hygiene and mask.  MAC    Evaluation Hemodynamic Status: BP stable throughout; O2 sats: stable throughout Patient's Current Condition: stable Complications: No apparent complications Patient did tolerate procedure well. Chest X-ray ordered to verify placement.  CXR: pending.   Antonietta Lansdowne 03/05/2018

## 2018-02-27 NOTE — Progress Notes (Signed)
Electrophysiology Rounding Note  Patient Name: Crystal Moran Date of Encounter: 03/01/2018  Primary Cardiologist: Rosemary Holms Electrophysiologist: Graciela Husbands   Subjective   The patient denies chest pain or shortness of breath.   Inpatient Medications    Scheduled Meds: . aspirin EC  81 mg Oral Daily  . Chlorhexidine Gluconate Cloth  6 each Topical Q0600  . clopidogrel  75 mg Oral Q breakfast  . docusate sodium  200 mg Oral QHS  . insulin aspart  0-15 Units Subcutaneous TID WC  . insulin glargine  20 Units Subcutaneous BH-q7a  . isosorbide mononitrate  30 mg Oral BH-q7a  . lidocaine  1 patch Transdermal Q24H  . losartan  25 mg Oral Daily  . mouth rinse  15 mL Mouth Rinse BID  . mouth rinse  15 mL Mouth Rinse BID  . mupirocin ointment  1 application Nasal BID  . pantoprazole  40 mg Oral Daily  . rosuvastatin  20 mg Oral q1800  . sodium chloride flush  3 mL Intravenous Q12H  . sodium chloride flush  3 mL Intravenous Q12H   Continuous Infusions: . sodium chloride 10 mL/hr at 02/22/2018 0600   PRN Meds: sodium chloride, acetaminophen, acetaminophen, acetaminophen, ALPRAZolam, nitroGLYCERIN, ondansetron (ZOFRAN) IV, ondansetron (ZOFRAN) IV, ondansetron (ZOFRAN) IV, sodium chloride flush, sodium chloride flush   Vital Signs    Vitals:   02/15/2018 0235 03/14/2018 0305 03/02/2018 0400 02/23/2018 0500  BP: 132/83 (!) 94/57 (!) 75/45 (!) 75/54  Pulse: 78 78 74 61  Resp: (!) 24 (!) 26 12 (!) 30  Temp:      TempSrc:      SpO2: 100% 100% 100% 94%  Weight:      Height:        Intake/Output Summary (Last 24 hours) at 03/11/2018 0735 Last data filed at 03/12/2018 0600 Gross per 24 hour  Intake 272.57 ml  Output 200 ml  Net 72.57 ml   Filed Weights   03/24/2018 0907 March 24, 2018 1152  Weight: 68 kg 74 kg    Physical Exam    GEN- The patient is elderly, frail, and chronically ill appearing, alert and oriented x 3 today.   Head- normocephalic, atraumatic Eyes-  Sclera clear,  conjunctiva pink Ears- hearing intact Oropharynx- clear Neck- supple Lungs- Clear to ausculation bilaterally, normal work of breathing Heart- Regular rate and rhythm (paced) GI- soft, NT, ND, + BS Extremities- no clubbing, cyanosis, or edema Skin- no rash or lesion Psych- euthymic mood, full affect Neuro- strength and sensation are intact  Labs    CBC Recent Labs    2018-03-24 0945 2018/03/24 0950 02/25/18 0437  WBC 11.5*  --  8.6  HGB 13.9 14.6 11.6*  HCT 42.3 43.0 35.9*  MCV 89.6  --  90.0  PLT 252  --  177   Basic Metabolic Panel Recent Labs    16/10/96 0945 03-24-2018 0950 02/25/18 0437  NA 135 136 136  K 3.8 3.9 3.4*  CL 102 101 104  CO2 23  --  22  GLUCOSE 409* 405* 176*  BUN 20 21 15   CREATININE 0.72 0.50 0.48  CALCIUM 8.9  --  8.4*   Liver Function Tests Recent Labs    Mar 24, 2018 0945  AST 49*  ALT 38  ALKPHOS 84  BILITOT 1.2  PROT 5.6*  ALBUMIN 3.4*   Cardiac Enzymes Recent Labs    03/24/2018 0945 2018/03/24 1318 02/25/18 0437  TROPONINI 0.14* 5.22* 10.98*   Hemoglobin A1C Recent Labs  02/22/2018 0945  HGBA1C 12.3*   Fasting Lipid Panel Recent Labs    02/25/2018 0945  CHOL 152  HDL 67  LDLCALC 70  TRIG 74  CHOLHDL 2.3     Telemetry    Sinus rhythm with v pacing (personally reviewed)  Radiology    No results found.  Assessment & Plan    1.  Complete heart block S/p PPM 02/17/2018 CXR reviewed. No obvious ptx. Not a lot of slack in V lead.  Device interrogation reviewed and normal Wound without complications Routine wound care and follow up (appts entered in AVS)  2.  CAD Per Dr Rosemary Holms  [facility] will sign off.   Medication Recommendations:  none Other recommendations (labs, testing, etc):  none Follow up as an outpatient:  As scheduled and entered in AVS  For questions or updates, please contact [facility] Please consult [email or web address] for contact info under Cardiology/STEMI.  Signed, Gypsy Balsam, NP    03/13/18, 7:35 AM

## 2018-02-27 NOTE — Consult Note (Addendum)
NAME:  Crystal Moran, MRN:  [id number], DOB:  [birth year], LOS: 3 ADMISSION DATE:  03-22-18, CONSULTATION DATE:  03/13/2018 REFERRING MD:  Dr. Rosemary Holms, CHIEF COMPLAINT:  Hypotension, AMS  Brief History     84 year old female who presented on 10/12 with progressive one day history of intermittent chest pain found to have acute changes on EKG of baseline RBB, new aVR and V1 STE and diffuse inferolateral STD taken emergently to cath lab.  Patient had prolonged arrest and required emergent transvenous pacemaker placement prior to cath due to asystole.  LHC then with sucessful PTCA and stent placement of SVG-LAD with DES stent.  While in the Cath Lab she had a cardiac arrest.  Patient continued to have intermittent complete heart block and therefore underwent permanent pacemaker placement on 10/14.   03/12/2018 she continued to be hypotensive and required intubation and placement of central line and arterial lines and started on vasopressors.  Pulmonary critical care was asked to consult.  Past Medical History  CAD status post CABG 1980s, hypertension, type 2 diabetes, questionable dementia Significant Hospital Events   03/01/2018 intubation  Consults: date of consult/date signed off & final recs:  02/23/2018 pulmonary critical care intubation  Procedures (surgical and bedside):  03/15/2018 endotracheal tube insertion>> 02/20/2018 A-line insertion>> 03/12/2018 central line insertion>>  Significant Diagnostic Tests:  10/12 LHC >> LM-LCx/OM1: Severe calcific 95-99% disease (Likely chronic, non-culprit) LAD: Ostially occluded, bypassed by SVG-LAD. Mid LAD 99% stenosis LCx: As above.  RCA: Large vessel. Mid 30% stenosis SVG-LAD: Proximal 99% stenosis. (Culprit) Successful PTCA and stent placemnt Synergy DES 4.0 X 8 mm 0% residual stenosis  Micro Data:  03/10/2018 sputum culture>> 03/10/2018 blood cultures x2>> 06/30/2017 urine culture>>  Antimicrobials:  02/16/2018  vancomycin>> 03/01/2018 ceftazidime>>  Subjective:  Elderly female who underwent cardiac catheterization along with permanent pacemaker placement developed SVT hypotension required intubation and institution of life supporting mechanisms.  Objective   Blood pressure (!) 75/54, pulse 61, temperature 99 F (37.2 C), temperature source Oral, resp. rate (!) 30, height 5\' 4"  (1.626 m), weight 74 kg, SpO2 94 %.        Intake/Output Summary (Last 24 hours) at 02/22/2018 1004 Last data filed at 02/17/2018 0800 Gross per 24 hour  Intake 291.63 ml  Output 200 ml  Net 91.63 ml   Filed Weights   03/22/18 0907 03/22/18 1152  Weight: 68 kg 74 kg    Examination: General: Frail elderly female who is intubated sedated on full mechanical ventilatory support HENT: Tracheal tube to ventilator orogastric tube Lungs: Coarse rhonchi with crackles throughout Cardiovascular: Heart sounds are regular currently less tachycardia 1:09 PM placed on vasopressors with transition from Levophed to Neo-Synephrine due to tachycardia Abdomen: Soft faint bowel sounds Extremities: Cool edema Neuro: Sedated on mechanical ventilatory support GU: Foley to be placed  Resolved Hospital Problem list     Assessment & Plan:  Vent dependent respiratory failure secondary to cardiac arrest Vent bundle Pulmonary toilet Empirical antibiotics Follow-up arterial blood gas following intubation Follow-up chest ray x-ray following intubation Note she is a limited CODE BLUE no CPR.  Coronary artery disease status post ST elevation MI with a revascularization of SVG-LAD with stent placement. Status post cardiac arrest Cath Lab Acute decompensation on 03/08/2018 requiring intubation vasopressors Intubated Pressure support Permanent pacemaker Place arterial line Further input from cardiology Check cortisol level Hold antihypertensives  Component of sepsis from sequestered infection Call antibiotics vancomycin and  ceftazidime day 0 Panculture Check procalcitonin    Disposition /  Summary of Today's Plan 03/10/2018   84 year old status post STEMI pacemaker who quickly declined on 02/16/2018 required intubation and required vasopressor support.  He has been made a limited CODE BLUE no CPR but all other avenues of resuscitation are available to her.  Pulmonary critical care has been asked to assist in her care.     Diet: N.p.o. for now we will need tube feedings Pain/Anxiety/Delirium protocol (if indicated): Protocol VAP protocol (if indicated): Initiated on 03/13/2018 DVT prophylaxis: Plavix GI prophylaxis: Pepcid Hyperglycemia protocol Mobility: Bedrest Code Status: Limited CODE BLUE no CPR but all other avenues are available Family Communication: Family communicated by Dr. Molli Knock 03/08/2018  Labs   CBC: Recent Labs  Lab 03/05/2018 0945 03/09/2018 0950 02/25/18 0437  WBC 11.5*  --  8.6  HGB 13.9 14.6 11.6*  HCT 42.3 43.0 35.9*  MCV 89.6  --  90.0  PLT 252  --  177    Basic Metabolic Panel: Recent Labs  Lab 03/03/2018 0945 02/14/2018 0950 02/25/18 0437 03/02/2018 0753  NA 135 136 136 133*  K 3.8 3.9 3.4* 4.2  CL 102 101 104 97*  CO2 23  --  22 18*  GLUCOSE 409* 405* 176* 429*  BUN 20 21 15  30*  CREATININE 0.72 0.50 0.48 1.75*  CALCIUM 8.9  --  8.4* 8.8*   GFR: Estimated Creatinine Clearance: 22.3 mL/min (A) (by C-G formula based on SCr of 1.75 mg/dL (H)). Recent Labs  Lab 03/08/2018 0945 02/25/18 0437  WBC 11.5* 8.6    Liver Function Tests: Recent Labs  Lab 02/23/2018 0945  AST 49*  ALT 38  ALKPHOS 84  BILITOT 1.2  PROT 5.6*  ALBUMIN 3.4*   No results for input(s): LIPASE, AMYLASE in the last 168 hours. No results for input(s): AMMONIA in the last 168 hours.  ABG    Component Value Date/Time   TCO2 24 02/25/2018 0950     Coagulation Profile: Recent Labs  Lab 02/25/2018 0945  INR 1.20    Cardiac Enzymes: Recent Labs  Lab 03/01/2018 0945 02/18/2018 1318  02/25/18 0437  TROPONINI 0.14* 5.22* 10.98*    HbA1C: Hgb A1c MFr Bld  Date/Time Value Ref Range Status  02/13/2018 09:45 AM 12.3 (H) 4.8 - 5.6 % Final    Comment:    (NOTE) Pre diabetes:          5.7%-6.4% Diabetes:              >6.4% Glycemic control for   <7.0% adults with diabetes     CBG: Recent Labs  Lab 18-Mar-2018 0647 18-Mar-2018 1113 03-18-18 1537 03/18/18 1636 03/01/2018 0729  GLUCAP 179* 198* 200* 171* 363*    Admitting History of Present Illness.   84 year old with a history of recent cardiac catheterization permanent pacemaker quickly declined and required intubation on 02/26/2018  Review of Systems:   Unavailable due to decreased level consciousness  Past Medical History  She,  has a past medical history of Coronary artery disease involving autologous vein bypass graft, Hypertension, and Uncontrolled type 2 diabetes mellitus (HCC).   Surgical History    Past Surgical History:  Procedure Laterality Date  . CORONARY ARTERY BYPASS GRAFT     1980s (SVG-LAD)  . CORONARY/GRAFT ACUTE MI REVASCULARIZATION N/A 03/14/2018   Procedure: Coronary/Graft Acute MI Revascularization;  Surgeon: Elder Negus, MD;  Location: [facility] INVASIVE CV LAB;  Service: Cardiovascular;  Laterality: N/A;  . LEFT HEART CATH AND CORONARY ANGIOGRAPHY N/A 03/01/2018   Procedure: LEFT HEART CATH  AND CORONARY ANGIOGRAPHY;  Surgeon: Elder Negus, MD;  Location: [facility] INVASIVE CV LAB;  Service: Cardiovascular;  Laterality: N/A;  . PACEMAKER IMPLANT N/A 03/02/2018   Procedure: PACEMAKER IMPLANT;  Surgeon: Duke Salvia, MD;  Location: [facility] INVASIVE CV LAB;  Service: Cardiovascular;  Laterality: N/A;  . TEMPORARY PACEMAKER N/A 02/23/2018   Procedure: TEMPORARY PACEMAKER;  Surgeon: Elder Negus, MD;  Location: [facility] INVASIVE CV LAB;  Service: Cardiovascular;  Laterality: N/A;     Social History   Social History   Socioeconomic History  . Marital status: Single    Spouse name: Not  on file  . Number of children: Not on file  . Years of education: Not on file  . Highest education level: Not on file  Occupational History  . Not on file  Social Needs  . Financial resource strain: Not on file  . Food insecurity:    Worry: Not on file    Inability: Not on file  . Transportation needs:    Medical: Not on file    Non-medical: Not on file  Tobacco Use  . Smoking status: Former Smoker    Packs/day: 2.00    Years: 18.00    Pack years: 36.00    Types: Cigarettes    Start date: 02/25/1961    Last attempt to quit: 02/26/1979    Years since quitting: 39.0  . Smokeless tobacco: Never Used  Substance and Sexual Activity  . Alcohol use: Not Currently  . Drug use: Never  . Sexual activity: Not on file  Lifestyle  . Physical activity:    Days per week: Not on file    Minutes per session: Not on file  . Stress: Not on file  Relationships  . Social connections:    Talks on phone: Not on file    Gets together: Not on file    Attends religious service: Not on file    Active member of club or organization: Not on file    Attends meetings of clubs or organizations: Not on file    Relationship status: Not on file  . Intimate partner violence:    Fear of current or ex partner: Not on file    Emotionally abused: Not on file    Physically abused: Not on file    Forced sexual activity: Not on file  Other Topics Concern  . Not on file  Social History Narrative   Patient lives in [place], [place] with her son Arisha Gervais, who is is also her HCPOA. Patient has another two children, but she is not in good terms with them. Patient is able to perform most ADL's independently. She is able to light cooking for herself. She only goes out occasionally to the church, and to her doctor's  appointments. . She last saw a cardiologist in [place] over two years ago (>before 2017). She is mentally alert, but tends to forget things in "stressful situations", as per her son.   ,   reports that she quit smoking about 39 years ago. Her smoking use included cigarettes. She started smoking about 57 years ago. She has a 36.00 pack-year smoking history. She has never used smokeless tobacco. She reports that she drank alcohol. She reports that she does not use drugs.   Family History   Her family history includes Heart disease in her son.   Allergies Allergies  Allergen Reactions  . Salmeterol Shortness Of Breath    Resp.   . Ranitidine Swelling  .  Ace Inhibitors     cough  . Amlodipine Besylate Swelling    Retains fluid  . Amoxicillin-Pot Clavulanate Nausea Only    Abd pain   . Clindamycin   . Clopidogrel Bisulfate Nausea Only  . Desloratadine Tinitus    H/a-face and neck flushing, tinnitus   . Felodipine     Chest pain/arm pain  . Fluticasone-Salmeterol     Resp.   . Iodine Swelling  . Kenalog  [Triamcinolone]     Chest pain   . Other     hayfever  . Pollen Extract   . Singulair  [Montelukast] Swelling    Muscle/bone pain-weight gain-h/a  . Synthroid  [Levothyroxine]     H/a, elevated BP  . Tiotropium     Pain in both arms/chest  . Tiotropium Bromide Monohydrate   . Triamcinolone Acetonide Nausea And Vomiting    Chest pain/nausea  . Fexofenadine Anxiety    H/a-face and neck flushing-tinnitus   . Latex Rash     Home Medications  Prior to Admission medications   Medication Sig Start Date End Date Taking? Authorizing Provider  acetaminophen (TYLENOL) 325 MG tablet Take 650 mg by mouth every 4 (four) hours as needed for mild pain.   Yes [provider]  ALPRAZolam Prudy Feeler) 0.5 MG tablet Take 0.5 mg by mouth 2 (two) times daily.   Yes [provider]  aspirin EC 81 MG tablet Take 81 mg by mouth daily.   Yes [provider]  atorvastatin (LIPITOR) 20 MG tablet Take 20 mg by mouth daily.   Yes [provider]  diclofenac sodium (VOLTAREN) 1 % GEL Apply 2 g topically 4 (four) times daily.   Yes [provider]  docusate sodium (COLACE) 100 MG capsule Take 200 mg by mouth at bedtime.   Yes [provider]  insulin glargine (LANTUS) 100 UNIT/ML injection Inject 20 Units into the skin every morning.   Yes [provider]  isosorbide mononitrate (IMDUR) 30 MG 24 hr tablet Take 30 mg by mouth every morning.   Yes [provider]  metoprolol succinate (TOPROL-XL) 25 MG 24 hr tablet Take 25 mg by mouth daily.   Yes [provider]  nitroGLYCERIN (NITROSTAT) 0.4 MG SL tablet Place 0.4 mg under the tongue every 5 (five) minutes as needed for chest pain.   Yes [provider]  pantoprazole (PROTONIX) 40 MG tablet Take 40 mg by mouth daily.   Yes [provider]     Critical care time: Per NP 45 minutes      Brett Canales Minor ACNP [facility] PCCM Pager [phone] till 1 pm If no answer page [phone] 02/14/2018, 11:18 AM  Attending Note:  84 year old female with extensive PMH who presents to PCCM with respiratory failure and circulatory shock with AMS.  Patient had a defibrillator implanted and had a cardiac arrest on the table.  This AM in the ICU the patient becomes very hypotensive and mental status begin to deteriorates.  PCCM was consulted for respiratory failure and shock.  On exam, the patient is moving all ext to command but extremely lethargic.  I reviewed CXR myself, pulmonary edema noted.  Discussed with cardiology MD who communicated with the son who wanted full code status.  I am concerned that the patient is septic with hypotension, hypothermia and AMS.  Upon arrival of the patient's son, we spoke at length, recommended strongly against CPR if something is to happen during intubation.  In  a perfect world would like diureses but given severe hypotension will not diurese.  Will start broad spectrum abx and pan cultures.  Stress dose steroids and cortisol level.  F/U on cultures.    The patient is critically ill with multiple organ  systems failure and requires high complexity decision making for assessment and support, frequent evaluation and titration of therapies, application of advanced monitoring technologies and extensive interpretation of multiple databases.   Critical Care Time devoted to patient care services described in this note is  90  Minutes. This time reflects time of care of this signee Dr Koren Bound. This critical care time does not reflect procedure time, or teaching time or supervisory time of PA/NP/Med student/Med Resident etc but could involve care discussion time.  Alyson Reedy, M.D. [facility] Pulmonary/Critical Care Medicine. Pager: [phone]. After hours pager: [phone].

## 2018-02-27 NOTE — Procedures (Signed)
Arterial Catheter Insertion Procedure Note Ebonique Trust Crago [id number] [birth year]  Procedure: Insertion of Arterial Catheter  Indications: Blood pressure monitoring and Frequent blood sampling  Procedure Details Consent: Unable to obtain consent because of emergent medical necessity. Time Out: Verified patient identification, verified procedure, site/side was marked, verified correct patient position, special equipment/implants available, medications/allergies/relevent history reviewed, required imaging and test results available.  Performed  Maximum sterile technique was used including antiseptics, cap, gloves, gown, hand hygiene, mask and sheet. Skin prep: Chlorhexidine; local anesthetic administered 20 gauge catheter was inserted into left radial artery using the Seldinger technique. ULTRASOUND GUIDANCE USED: YES Evaluation Blood flow good; BP tracing good. Complications: No apparent complications.   YACOUB,WESAM 02/19/2018

## 2018-02-27 NOTE — Progress Notes (Signed)
PT Cancellation Note  Patient Details Name: Fredonia Donnette Macmullen MRN: [id number] DOB: 1928-12-18   Cancelled Treatment:    Reason Eval/Treat Not Completed: Medical issues which prohibited therapy. Pt with decline in med status. Currently awaiting intubation. PT to follow up tomorrow to proceed with eval if medically ready or d/c if no improvement in med status.   Ilda Foil 02/21/2018, 10:00 AM   Aida Raider, PT  Office # [phone] Pager [phone]

## 2018-02-27 NOTE — Procedures (Signed)
OGT placed by MD under direct laryngoscopy and verified by auscultation.  AXR pending  Alyson Reedy, M.D. [facility] Pulmonary/Critical Care Medicine. Pager: [phone]. After hours pager: [phone]

## 2018-02-27 NOTE — Procedures (Signed)
Central Venous Catheter Insertion Procedure Note Crystal Moran [id number] Jan 29, 1929  Procedure: Insertion of Central Venous Catheter Indications: Assessment of intravascular volume, Drug and/or fluid administration and Frequent blood sampling  Procedure Details Consent: Risks of procedure as well as the alternatives and risks of each were explained to the (patient/caregiver).  Consent for procedure obtained. Time Out: Verified patient identification, verified procedure, site/side was marked, verified correct patient position, special equipment/implants available, medications/allergies/relevent history reviewed, required imaging and test results available.  Performed  Maximum sterile technique was used including antiseptics, cap, gloves, gown, hand hygiene, mask and sheet. Skin prep: Chlorhexidine; local anesthetic administered A antimicrobial bonded/coated triple lumen catheter was placed in the right internal jugular vein using the Seldinger technique.  Evaluation Blood flow good Complications: No apparent complications Patient did tolerate procedure well. Chest X-ray ordered to verify placement.  CXR: pending.  U/S used during insertion  [name] 02/19/2018, 11:00 AM

## 2018-02-28 ENCOUNTER — Inpatient Hospital Stay (HOSPITAL_COMMUNITY): Payer: Medicare Other

## 2018-02-28 LAB — RENAL FUNCTION PANEL
Albumin: 3 g/dL — ABNORMAL LOW (ref 3.5–5.0)
Anion gap: 13 (ref 5–15)
BUN: 41 mg/dL — ABNORMAL HIGH (ref 8–23)
CALCIUM: 8.2 mg/dL — AB (ref 8.9–10.3)
CO2: 21 mmol/L — ABNORMAL LOW (ref 22–32)
CREATININE: 2.57 mg/dL — AB (ref 0.44–1.00)
Chloride: 101 mmol/L (ref 98–111)
GFR, EST AFRICAN AMERICAN: 18 mL/min — AB (ref >60)
GFR, EST NON AFRICAN AMERICAN: 16 mL/min — AB (ref >60)
Glucose, Bld: 136 mg/dL — ABNORMAL HIGH (ref 70–99)
Phosphorus: 5 mg/dL — ABNORMAL HIGH (ref 2.5–4.6)
Potassium: 4.1 mmol/L (ref 3.5–5.1)
SODIUM: 135 mmol/L (ref 135–145)

## 2018-02-28 LAB — GLUCOSE, CAPILLARY
GLUCOSE-CAPILLARY: 176 mg/dL — AB (ref 70–99)
GLUCOSE-CAPILLARY: 123 mg/dL — AB (ref 70–99)
GLUCOSE-CAPILLARY: 130 mg/dL — AB (ref 70–99)
Glucose-Capillary: 106 mg/dL — ABNORMAL HIGH (ref 70–99)
Glucose-Capillary: 126 mg/dL — ABNORMAL HIGH (ref 70–99)
Glucose-Capillary: 130 mg/dL — ABNORMAL HIGH (ref 70–99)
Glucose-Capillary: 151 mg/dL — ABNORMAL HIGH (ref 70–99)

## 2018-02-28 LAB — CBC
HCT: 38.5 % (ref 36.0–46.0)
Hemoglobin: 12.5 g/dL (ref 12.0–15.0)
MCH: 29 pg (ref 26.0–34.0)
MCHC: 32.5 g/dL (ref 30.0–36.0)
MCV: 89.3 fL (ref 80.0–100.0)
NRBC: 0.1 % (ref 0.0–0.2)
PLATELETS: 180 10*3/uL (ref 150–400)
RBC: 4.31 MIL/uL (ref 3.87–5.11)
RDW: 14 % (ref 11.5–15.5)
WBC: 18.9 10*3/uL — AB (ref 4.0–10.5)

## 2018-02-28 LAB — BLOOD GAS, ARTERIAL
Acid-base deficit: 3.1 mmol/L — ABNORMAL HIGH (ref 0.0–2.0)
BICARBONATE: 21 mmol/L (ref 20.0–28.0)
FIO2: 40
LHR: 24 {breaths}/min
O2 Saturation: 98.4 %
PEEP/CPAP: 8 cmH2O
PH ART: 7.391 (ref 7.350–7.450)
Patient temperature: 98.6
VT: 450 mL
pCO2 arterial: 35.4 mmHg (ref 32.0–48.0)
pO2, Arterial: 188 mmHg — ABNORMAL HIGH (ref 83.0–108.0)

## 2018-02-28 LAB — MAGNESIUM: MAGNESIUM: 1.8 mg/dL (ref 1.7–2.4)

## 2018-02-28 MED ORDER — MIDAZOLAM BOLUS VIA INFUSION (WITHDRAWAL LIFE SUSTAINING TX)
5.0000 mg | INTRAVENOUS | Status: DC | PRN
  Administered 2018-02-28: 5 mg via INTRAVENOUS
  Filled 2018-02-28: qty 20

## 2018-02-28 MED ORDER — FENTANYL 2500MCG IN NS 250ML (10MCG/ML) PREMIX INFUSION: 100.0000 ug/h | INTRAVENOUS | Status: DC

## 2018-02-28 MED ORDER — DEXTROSE 50 % IV SOLN
INTRAVENOUS | Status: AC
  Administered 2018-02-27: 14 mL via INTRAVENOUS
  Filled 2018-02-28: qty 50

## 2018-02-28 MED ORDER — SODIUM CHLORIDE 0.9 % IV SOLN
10.0000 mg/h | INTRAVENOUS | Status: DC
  Administered 2018-02-28: 10 mg/h via INTRAVENOUS
  Filled 2018-02-28: qty 10

## 2018-02-28 MED ORDER — FENTANYL BOLUS VIA INFUSION
50.0000 ug | INTRAVENOUS | Status: DC | PRN
  Filled 2018-02-28: qty 200

## 2018-02-28 MED FILL — Medication: Qty: 1 | Status: AC

## 2018-03-04 LAB — CULTURE, BLOOD (ROUTINE X 2)
CULTURE: NO GROWTH
Culture: NO GROWTH
Special Requests: ADEQUATE

## 2018-03-12 ENCOUNTER — Ambulatory Visit: Payer: Medicare (Managed Care)

## 2018-03-16 NOTE — Progress Notes (Signed)
Patient extubated per withdrawal of care protocol.  Patient tolerated well.  No complications.

## 2018-03-16 NOTE — Plan of Care (Signed)
  Problem: Clinical Measurements: Goal: Ability to maintain clinical measurements within normal limits will improve Outcome: Progressing Goal: Will remain free from infection Outcome: Progressing Goal: Respiratory complications will improve Outcome: Progressing   Problem: Pain Managment: Goal: General experience of comfort will improve Outcome: Progressing   Problem: Safety: Goal: Ability to remain free from injury will improve Outcome: Progressing   

## 2018-03-16 NOTE — Progress Notes (Signed)
Nutrition Brief Note  Chart reviewed. Per RN plan to transition to comfort care.  No nutrition interventions warranted at this time.  Please consult as needed.   Kendell Bane RD, LDN, CNSC [phone] Pager [phone] After Hours Pager

## 2018-03-16 NOTE — Progress Notes (Signed)
I received a [place] to provide spiritual support for the patient and family member. I visited the patient's room, however the patient had passed and the family member was not available. I talked with the nurse and in her conversation with the family, she asked them if they needed Chaplain support. They did not require additional support at this time.    03/06/18 1000  Clinical Encounter Type  Visited With Health care provider  Visit Type Spiritual support  Referral From Nurse  Consult/Referral To Chaplain    Chaplain Dr Melvyn Novas

## 2018-03-16 NOTE — Discharge Summary (Signed)
Death Summary:  Crystal Moran is a 84 y.o. Female with CAD s/o CABG in 1980s, hypertension, type 2 DM, ?mild dementia, presented to [facility] ED with intermittent chest pain since 02/23/18 evening, got worse early this morning. EKG showed baseline sinus rhythm with RBBB, new aVR and V1 STE with diffuse ST depression in inferolateral leads. She was thus emergently taken to cath lab.  Cath showed severe crepitation in proximal part of SVG-LAD, and nonculprit severe calcific disease in LM-LCx. Patient with successful PTCA and stent placement SVG-LAD (Synergy DES 4.0 X 8 mm). Patient required emergent transvenous pacemaker placement prior to intervention due to asystole. Patient underwent PPM on 03/15/2018 given recurrent third degree AV block in spite of successful revascularization.  Patient was recovering fairly well, until 10/15 morning when she had hypothermia and mild leukocytosis, rapid deterioration with fluid refractory hypotension, followed by poor mentation and inability to maintain airway. This led to intubation and use two vasopressors for refractory shock. Patient also had episode of sustained ventricular tachycardia requiring 150 mg amiodarone. Troponin was elevated at 65, with lactic acid at 9. Differentials included new acute coronary syndrome leading to cardiogenic shock, severe sepsis leading to supply demand mismatch, as well as gut bowel ischemia leading to lactic acidosis.  I had a long conversation with patient's son and [name] on 03/11/2018 regarding patient's current condition, prognosis, and options for further care. Given patient's advanced age, dementia, he prefers conservative approach with no escalation of therapy, and no further resuscitation attempts.   Given patient's continued deterioration, on morning of 02/23/2018, her son and HCPOA Jupiter Boys requested the patient to be extubated and follow comfort care measures.  Patient died  soon after.  I was not present at the time of patient's death. patient was pronounced by ICU. Patient's older son [name] requested autopsy.   Immediate cause of death: Circulatory shock  Arising from  Anterior wall myocardial infarction Coronary artery disease Uncontrolled type 2 Diabetes melitus Hyperlipidemia   Darrick Greenlaw Emiliano Dyer, MD [facility] Cardiovascular. [place] Pager: [phone] Office: [phone] If no answer Cell [phone]

## 2018-03-16 NOTE — Progress Notes (Addendum)
Older son Crystal Moran called and requested that an autopsy is completed, Specifically a toxicology report. Shared that they may have concerns their mother was dosed higher than she should have been. Wants results sent to [address] [place] , [place] [address]

## 2018-03-16 NOTE — Progress Notes (Signed)
Son stated he would like to proceed with comfort care and extubate at this time. CCM paged.

## 2018-03-16 NOTE — Consult Note (Signed)
NAME:  Crystal Moran, MRN:  [id number], DOB:  01/01/1929, LOS: 4 ADMISSION DATE:  02/25/2018, CONSULTATION DATE:  March 18, 2018 REFERRING MD:  Dr. Rosemary Holms, CHIEF COMPLAINT:  Hypotension, AMS  Brief History     84 year old female who presented on 10/12 with progressive one day history of intermittent chest pain found to have acute changes on EKG of baseline RBB, new aVR and V1 STE and diffuse inferolateral STD taken emergently to cath lab.  Patient had prolonged arrest and required emergent transvenous pacemaker placement prior to cath due to asystole.  LHC then with sucessful PTCA and stent placement of SVG-LAD with DES stent.  While in the Cath Lab she had a cardiac arrest.  Patient continued to have intermittent complete heart block and therefore underwent permanent pacemaker placement on 10/14.   18-Mar-2018 she continued to be hypotensive and required intubation and placement of central line and arterial lines and started on vasopressors.  Pulmonary critical care was asked to consult.  Past Medical History  CAD status post CABG 1980s, hypertension, type 2 diabetes, questionable dementia  Significant Hospital Events   March 18, 2018 intubation  Consults: date of consult/date signed off & final recs:  Mar 18, 2018 pulmonary critical care intubation  Procedures (surgical and bedside):  03-18-2018 endotracheal tube insertion>> March 18, 2018 A-line insertion>> 18-Mar-2018 central line insertion>>  Significant Diagnostic Tests:  10/12 LHC >> LM-LCx/OM1: Severe calcific 95-99% disease (Likely chronic, non-culprit) LAD: Ostially occluded, bypassed by SVG-LAD. Mid LAD 99% stenosis LCx: As above.  RCA: Large vessel. Mid 30% stenosis SVG-LAD: Proximal 99% stenosis. (Culprit) Successful PTCA and stent placemnt Synergy DES 4.0 X 8 mm 0% residual stenosis  Micro Data:  March 18, 2018 sputum culture>> 18-Mar-2018 blood cultures x2>> 06/30/2017 urine culture>>  Antimicrobials:  March 18, 2018  vancomycin>> 03-18-18 ceftazidime>>  Subjective:  Unresponsive this AM on fentanyl at the son's requests, no UOP overnight and hemodynamics remain very poor.  Objective   Blood pressure (!) 54/36, pulse (!) 115, temperature (!) 97.1 F (36.2 C), temperature source Oral, resp. rate (!) 23, height 5\' 4"  (1.626 m), weight 74 kg, SpO2 97 %. CVP:  [1 mmHg-16 mmHg] 10 mmHg  Vent Mode: PRVC FiO2 (%):  [40 %-60 %] 40 % Set Rate:  [24 bmp] 24 bmp Vt Set:  [450 mL] 450 mL PEEP:  [5 cmH20-10 cmH20] 5 cmH20 Plateau Pressure:  [11 cmH20-23 cmH20] 16 cmH20   Intake/Output Summary (Last 24 hours) at 03/06/2018 0851 Last data filed at 03/02/2018 0700 Gross per 24 hour  Intake 1736.35 ml  Output -  Net 1736.35 ml   Filed Weights   03/06/2018 0907 03/15/2018 1152  Weight: 68 kg 74 kg    Examination: General: Comfortable on vent HENT: [place]/AT, PERRL, EOM-I and MMM Lungs: Coarse BS diffusely Cardiovascular: Regular, paced, Nl S1/S2 and -M/R/G Abdomen: Soft, NT, ND and +BS Extremities: Cool edema Neuro: Sedate on mechanical ventilation GU: Foley to be placed  Resolved Hospital Problem list     Assessment & Plan:  Vent dependent respiratory failure secondary to cardiac arrest Comfort measures Extubate O2 for comfort  Coronary artery disease status post ST elevation MI with a revascularization of SVG-LAD with stent placement. Status post cardiac arrest Cath Lab Acute decompensation on 2018-03-18 requiring intubation vasopressors D/C pressors  Component of sepsis from sequestered infection D/C abx  GOC: DNR and comfort care  Disposition / Summary of Today's Plan 02/19/2018   Conversation with son, after discussion decision was made that patient is not improving and is unlikely to improve and with  age and how fragile she is comfort care is the appropriate course of action.  Will increase fentanyl then extubate to comfort.  Confirmed DNR with the son.     Diet: N.p.o. for now we will  need tube feedings Pain/Anxiety/Delirium protocol (if indicated): Protocol VAP protocol (if indicated): Initiated on 03/01/18 DVT prophylaxis: Plavix GI prophylaxis: Pepcid Hyperglycemia protocol Mobility: Bedrest Code Status: Limited CODE BLUE no CPR but all other avenues are available Family Communication: Family communicated by Dr. Molli Knock 03-01-18  Labs   CBC: Recent Labs  Lab 03/10/2018 0945 03/14/2018 0950 02/25/18 0437 March 01, 2018 1124 Mar 01, 2018 1130 03/09/2018 0622  WBC 11.5*  --  8.6  --  14.3* 18.9*  HGB 13.9 14.6 11.6* 11.9* 11.6* 12.5  HCT 42.3 43.0 35.9* 35.0* 37.2 38.5  MCV 89.6  --  90.0  --  93.9 89.3  PLT 252  --  177  --  149* 180    Basic Metabolic Panel: Recent Labs  Lab 03/07/2018 0945  02/25/18 0437 2018-03-01 0753 03/01/18 1124 March 01, 2018 1130 03/01/2018 1131 03/11/2018 0622  NA 135   < > 136 133* 133*  --  136 135  K 3.8   < > 3.4* 4.2 4.9  --  4.9 4.1  CL 102   < > 104 97* 99  --  100 101  CO2 23  --  22 18*  --   --  16* 21*  GLUCOSE 409*   < > 176* 429* 432*  --  443* 136*  BUN 20   < > 15 30* 39*  --  31* 41*  CREATININE 0.72   < > 0.48 1.75* 1.50*  --  1.75* 2.57*  CALCIUM 8.9  --  8.4* 8.8*  --   --  8.1* 8.2*  MG  --   --   --   --   --  2.1  --  1.8  PHOS  --   --   --   --   --   --  7.8* 5.0*   < > = values in this interval not displayed.   GFR: Estimated Creatinine Clearance: 15.2 mL/min (A) (by C-G formula based on SCr of 2.57 mg/dL (H)). Recent Labs  Lab 03/09/2018 0945 02/25/18 0437 03/01/2018 1130 03-01-18 1132 03/01/18 1513 03/04/2018 0622  PROCALCITON  --   --  0.25  --   --   --   WBC 11.5* 8.6 14.3*  --   --  18.9*  LATICACIDVEN  --   --   --  9.4* 9.3*  --     Liver Function Tests: Recent Labs  Lab 03/10/2018 0945 03/01/18 1130 March 01, 2018 1131 02/25/2018 0622  AST 49* 399*  --   --   ALT 38 190*  --   --   ALKPHOS 84 92  --   --   BILITOT 1.2 2.1*  --   --   PROT 5.6* 4.8*  --   --   ALBUMIN 3.4* 2.6* 2.6* 3.0*   No results  for input(s): LIPASE, AMYLASE in the last 168 hours. No results for input(s): AMMONIA in the last 168 hours.  ABG    Component Value Date/Time   PHART 7.391 03/08/2018 0330   PCO2ART 35.4 03/08/2018 0330   PO2ART 188 (H) 03/07/2018 0330   HCO3 21.0 02/25/2018 0330   TCO2 21 (L) 03-01-18 1124   ACIDBASEDEF 3.1 (H) 03/02/2018 0330   O2SAT 98.4 02/20/2018 0330     Coagulation Profile: Recent Labs  Lab Mar 03, 2018 0945 03/08/2018 1130  INR 1.20 1.64    Cardiac Enzymes: Recent Labs  Lab 03/03/2018 0945 03/03/18 1318 02/25/18 0437 02/16/2018 1130 02/25/2018 1513  TROPONINI 0.14* 5.22* 10.98* >65.00* >65.00*    HbA1C: Hgb A1c MFr Bld  Date/Time Value Ref Range Status  03-03-2018 09:45 AM 12.3 (H) 4.8 - 5.6 % Final    Comment:    (NOTE) Pre diabetes:          5.7%-6.4% Diabetes:              >6.4% Glycemic control for   <7.0% adults with diabetes     CBG: Recent Labs  Lab 02/16/2018 0123 03/15/2018 0230 03/08/2018 0339 03/10/2018 0506 02/14/2018 0616  GLUCAP 130* 123* 106* 130* 126*    Admitting History of Present Illness.    The patient is critically ill with multiple organ systems failure and requires high complexity decision making for assessment and support, frequent evaluation and titration of therapies, application of advanced monitoring technologies and extensive interpretation of multiple databases.   Critical Care Time devoted to patient care services described in this note is  31  Minutes. This time reflects time of care of this signee Dr Koren Bound. This critical care time does not reflect procedure time, or teaching time or supervisory time of PA/NP/Med student/Med Resident etc but could involve care discussion time.  Alyson Reedy, M.D. [facility]. Pager: [phone]. After hours pager: [phone].

## 2018-03-16 NOTE — Progress Notes (Signed)
Subjective:  Intubated, sedated Very restless all night. Continues to by hypotensive with no urine output, increasing creatinine.   Son is requesting extubation and ensure comfort care measures.   Objective:  Vital Signs in the last 24 hours: Temp:  [97.1 F (36.2 C)-97.5 F (36.4 C)] 97.1 F (36.2 C) (10/16 0000) Pulse Rate:  [59-180] 115 (10/16 0730) Resp:  [0-32] 23 (10/16 0730) BP: (54-116)/(36-58) 54/36 (10/16 0724) SpO2:  [70 %-100 %] 97 % (10/16 0730) Arterial Line BP: (54-107)/(37-79) 54/37 (10/16 0730) FiO2 (%):  [40 %-60 %] 40 % (10/16 0724)  Intake/Output from previous day: 10/15 0701 - 10/16 0700 In: 1745.4 [I.V.:1145.3; IV Piggyback:600.1] Out: -  Intake/Output from this shift: No intake/output data recorded.  Physical Exam: Nursing noteand vitalsreviewed. Constitutional: Intubated, sedated HENT:  Head:Normocephalicand atraumatic.  Eyes:Pupils are equal, round, and reactive to light.Conjunctivaeare normal.  Neck:Normal range of motion.Neck supple.No JVDpresent.  Cardiovascular:Normal rate,regular rhythmand normal heart sounds. No murmurheard. Pulses: Femoral pulses are 2+on the right side, and 2+on the left side. Dorsalis pedis pulses are 1+on the right side, and 1+on the left side.  Posterior tibial pulses are 1+on the right side, and 1+on the left side.  Respiratory:B/l coarse breath sounds Sternotomy scar ZO:XWRU.Bowel sounds are normal. There isno tenderness. There isno rebound.  Musculoskeletal: She exhibitsedema(Trace b/l).  Lymphadenopathy:  She has no cervical adenopathy.  Neurological:Intubated, sedated.  Skin: Skin iswarmand dry.  Psychiatric: Did not assesses  Lab Results: Recent Labs    03/14/2018 1130 2018-03-05 0622  WBC 14.3* 18.9*  HGB 11.6* 12.5  PLT 149* 180   Recent Labs    02/14/2018 1131 05-Mar-2018 0622  NA 136 135  K 4.9 4.1  CL 100 101  CO2 16* 21*  GLUCOSE 443* 136*  BUN  31* 41*  CREATININE 1.75* 2.57*   Recent Labs    03/05/2018 1130 02/25/2018 1513  TROPONINI >65.00* >65.00*   Hepatic Function Panel Recent Labs    03/14/2018 1130  Mar 05, 2018 0622  PROT 4.8*  --   --   ALBUMIN 2.6*   < > 3.0*  AST 399*  --   --   ALT 190*  --   --   ALKPHOS 92  --   --   BILITOT 2.1*  --   --   BILIDIR 0.9*  --   --   IBILI 1.2*  --   --    < > = values in this interval not displayed.   Cardiac studies:  EKG 03/12/2018 post PCI: Sinus rhythm 100 bpm. Right bundle branch block. Inferior T-wave inversions in lateral ST depressions improved compared to EKG on arrival  Cath 02/15/2018: LM-LCx/OM1: Severe calcific 95-99% disease (Likely chronic, non-culprit) LAD: Ostially occluded, bypassed by SVG-LAD. Mid LAD 99% stenosis LCx: As above.  RCA: Large vessel. Mid 30% stenosis SVG-LAD: Proximal 99% stenosis. (Culprit) Successful PTCA and stent placemnt Synergy DES 4.0 X 8 mm 0% residual stenosis  Hospital echocardiogram 03/01/2018: - Left ventricle: The cavity size was normal. The estimated ejection fraction was in the range of 40% to 45%. Wall motion was normal; there were no regional wall motion abnormalities. The study is not technically sufficient to allow evaluation of LV diastolic function. - Aortic valve: Moderately calcified annulus. - Inadequate Doppler evaluation of mitral valve. Mild mitral stenosis cannot be excluded. No significant valvular regurgitation.  Assessment/Plan: Severe shock:  Rapid deterioration in hemodynamics 10/15 monring. Lactic acid 9. Troponin >65. Differentials include another acute coronary event leading to cardiogenic shock,  or septic shock leading to profound ischemia. I appreciate input from critical care team. She is critically ill with guarded prognosis at this time. Her morality risk is high and unlikely to change in spite of aggressive measures- including, but not limited to coronary angiogram, circulatory  support, escalation of vasopressor therapy.  I had a long conversation with patient's son and Gabrial Poppell on 10/15 regarding patient's current condition, prognosis, and options for further care. Given patient's advanced age, dementia, he prefers conservative approach with no escalation of therapy, and no further resuscitation attempts.   Given patient's continued deterioration, on morning of 10/16, her son and HCPOA Yolande Skoda requested the patient to be extubated and follow comfort care measures.  We'll make arrangements for the same.  Successful culprit artery revascularization to SVG-LAD Synergy 4.0 x 8 mm drug-eluting stent 10/12 Residual severe calcific disease LM-LCx stenosis Successful dual chamber PPM 03/02/2018 due to intermittent complete heart block in spite of successful revascularization. Uncontrolled type 2 DM Possible mild dementia   LOS: 4 days    Alin Chavira J Juergen Hardenbrook 03/05/2018, 10:40 AM  Rashaun Wichert Emiliano Dyer, MD [facility]. [place] Pager: [phone] Office: [phone] If no answer Cell [phone]

## 2018-03-16 NOTE — Progress Notes (Signed)
Pt passed away at 0957am, family at bedside. Devota Pace RN and Peggye Fothergill RN auscultated for 3 mins without hearing any heart or lung sounds. CDS notified by elink. Pt's son at bed side took home pt belongings. 100mg  of Fent and 40 of versed wasted by Devota Pace RN and Peggye Fothergill RN. Will contact pt placement.

## 2018-03-16 DEATH — deceased

## 2018-05-29 ENCOUNTER — Encounter: Payer: Medicare (Managed Care) | Admitting: Internal Medicine

## 2020-03-30 IMAGING — DX DG CHEST 1V PORT
1 series · 1 of 1 positions shown · non-contrast
Comparison: 02/11/2018

CLINICAL DATA: Pacemaker displacement. Evaluate transvenous pacer
lead.

EXAM:
PORTABLE CHEST 1 VIEW

[chest ap]
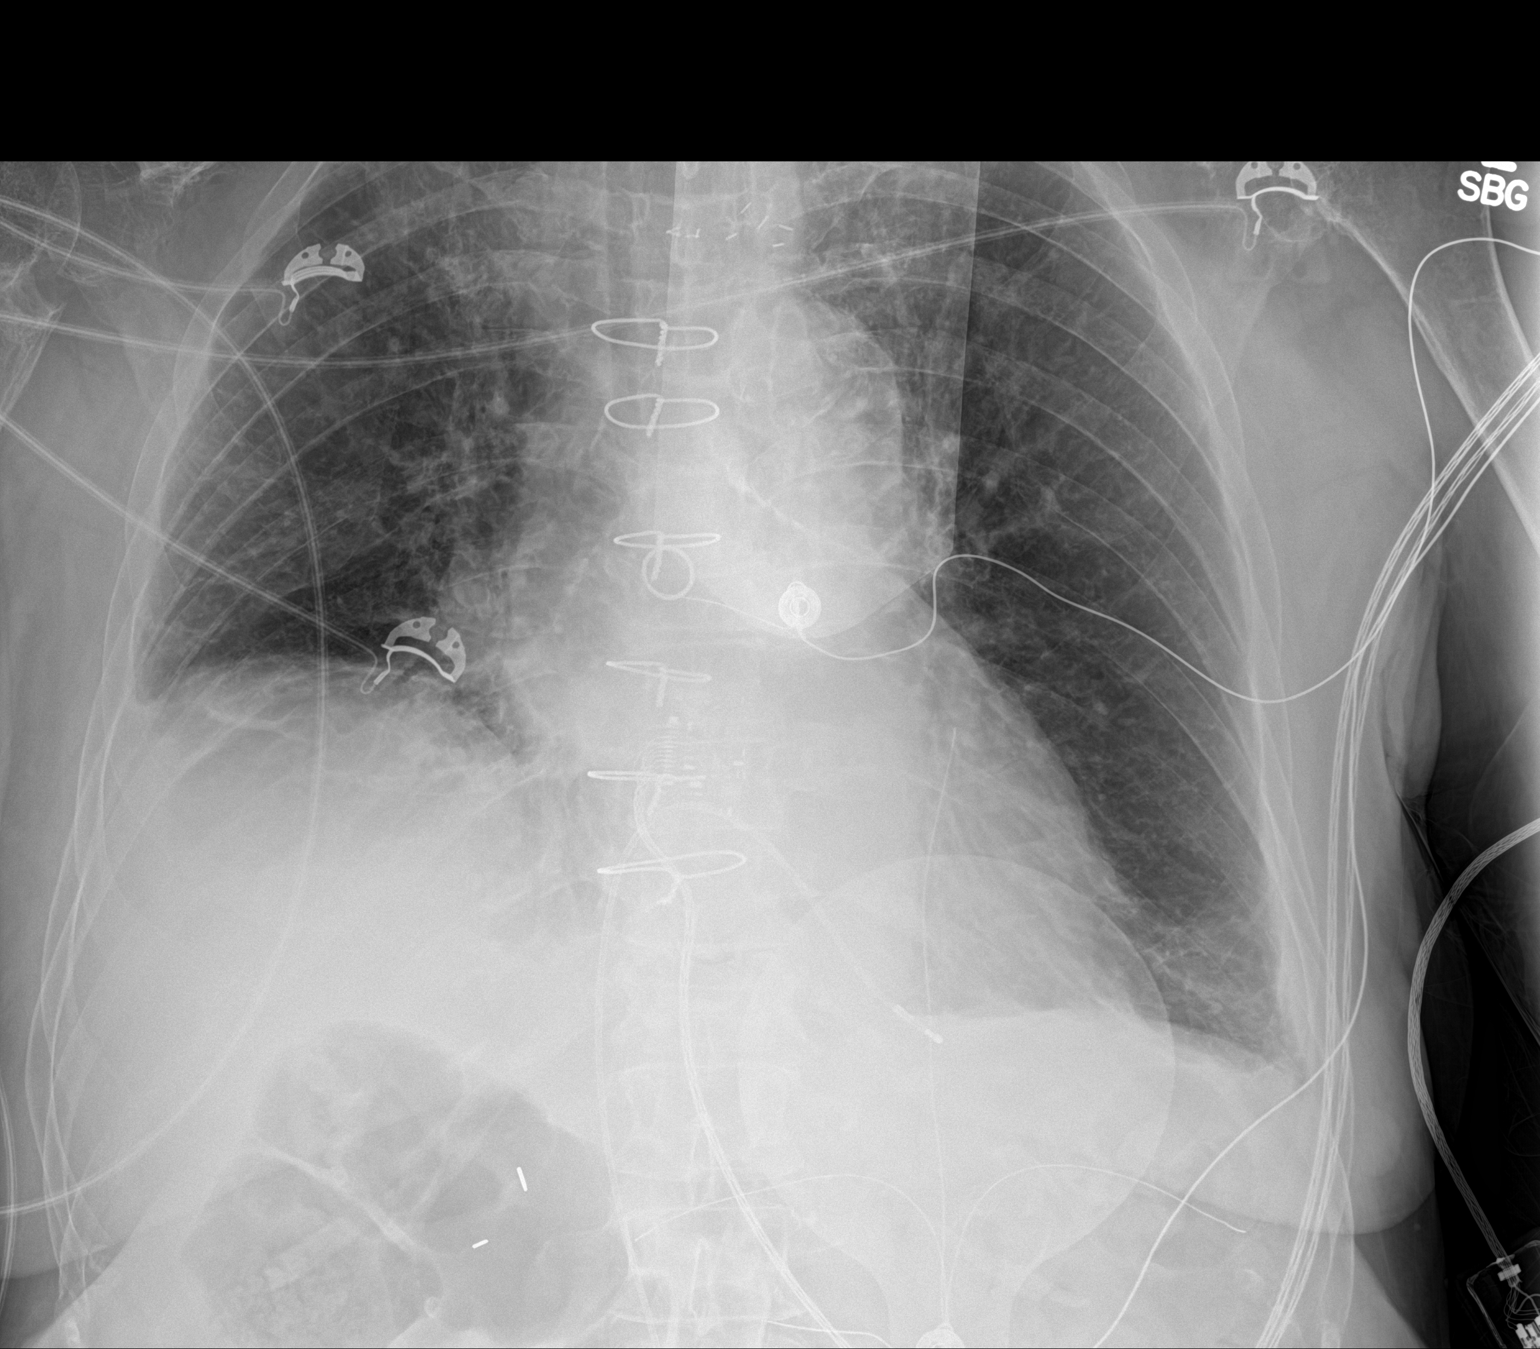

[1 of 1 positions shown; findings below may reference images not displayed]

FINDINGS: Femoral transvenous pacer lead is present. Pacer lead in the region
of the right ventricle. Cardiac pads overlying the chest. Chronic
elevation of the right hemidiaphragm. Coarse interstitial lung
markings particularly at the lung bases appear chronic. No focal
airspace disease. Heart and mediastinum are stable. Prior median
sternotomy. Trachea is midline.
IMPRESSION: 1. Transvenous pacer wire in the right ventricle region.
2. Prominent interstitial lung markings appear chronic. Difficult to
exclude mild edema.

## 2020-04-01 IMAGING — DX DG CHEST 1V PORT
1 series · 2 of 2 positions shown · non-contrast
Comparison: February 25, 2018

CLINICAL DATA: Pacemaker.  Confusion and weakness.

EXAM:
PORTABLE CHEST 1 VIEW

[Series 1: chest · 0.14mm/px · 2 of 2 slices shown]
[im 1/2]
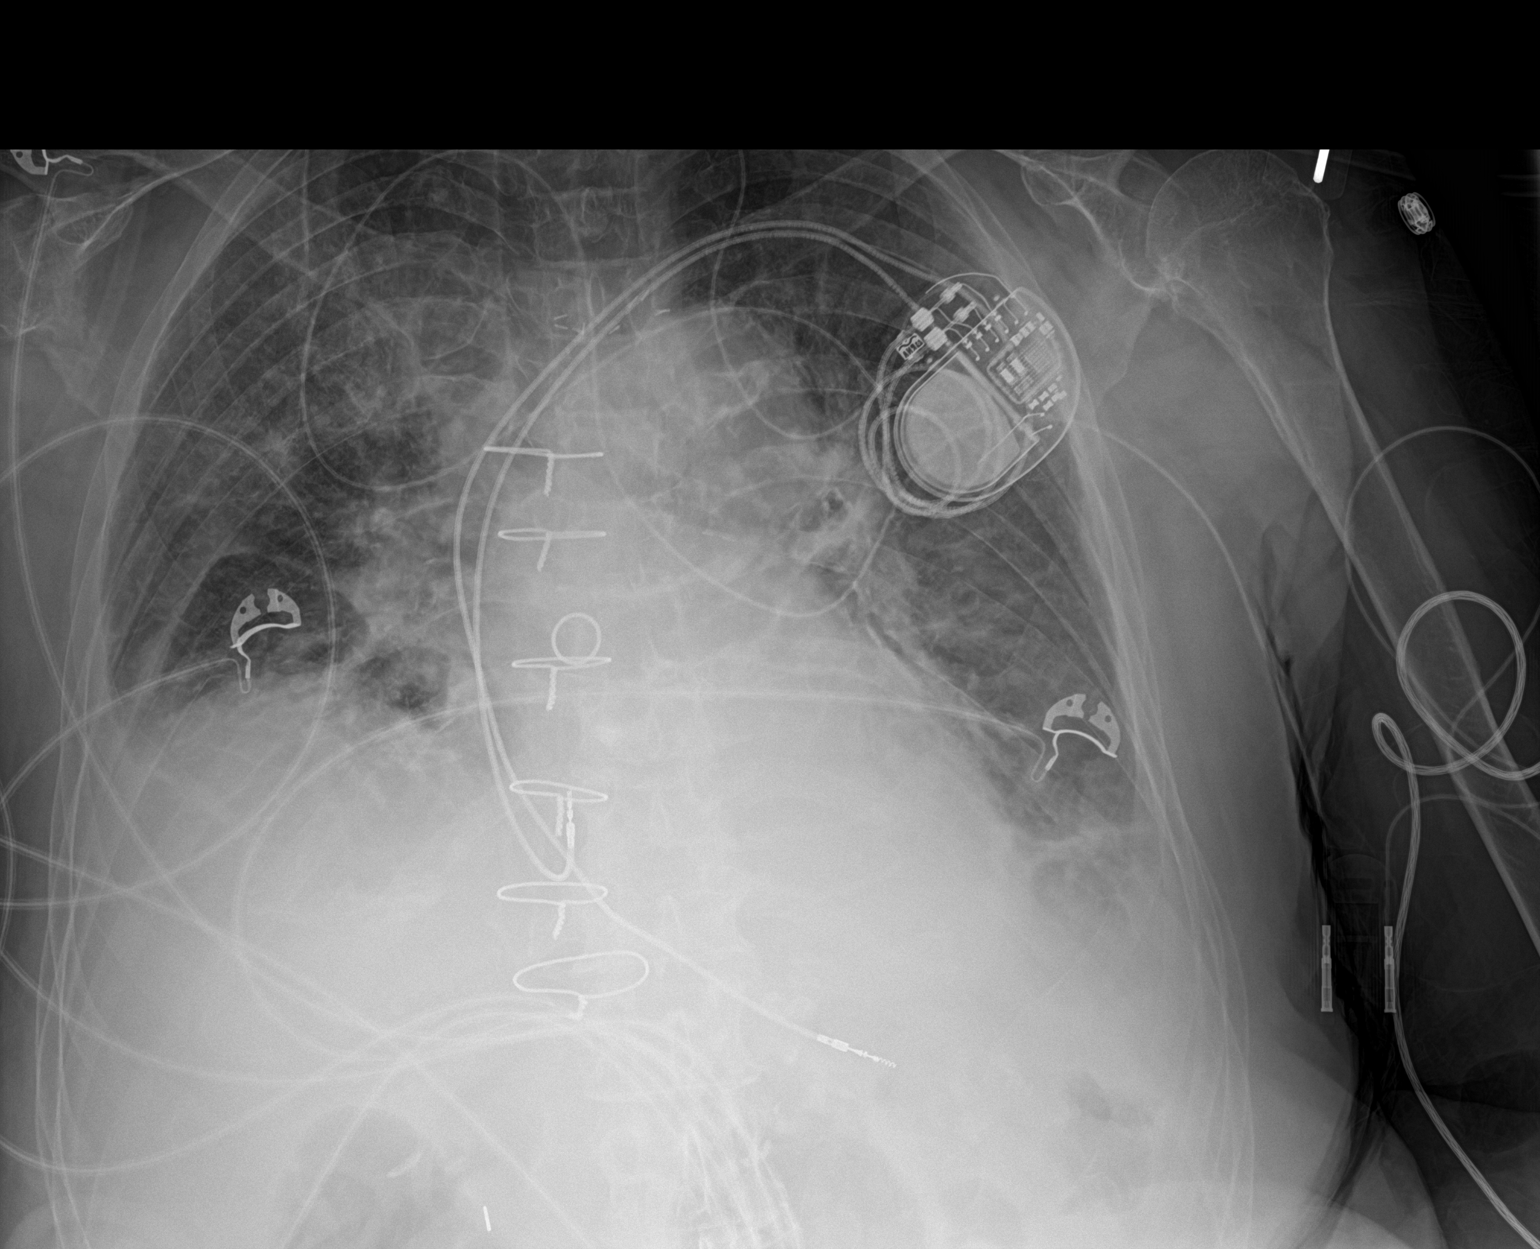
[im 2/2]
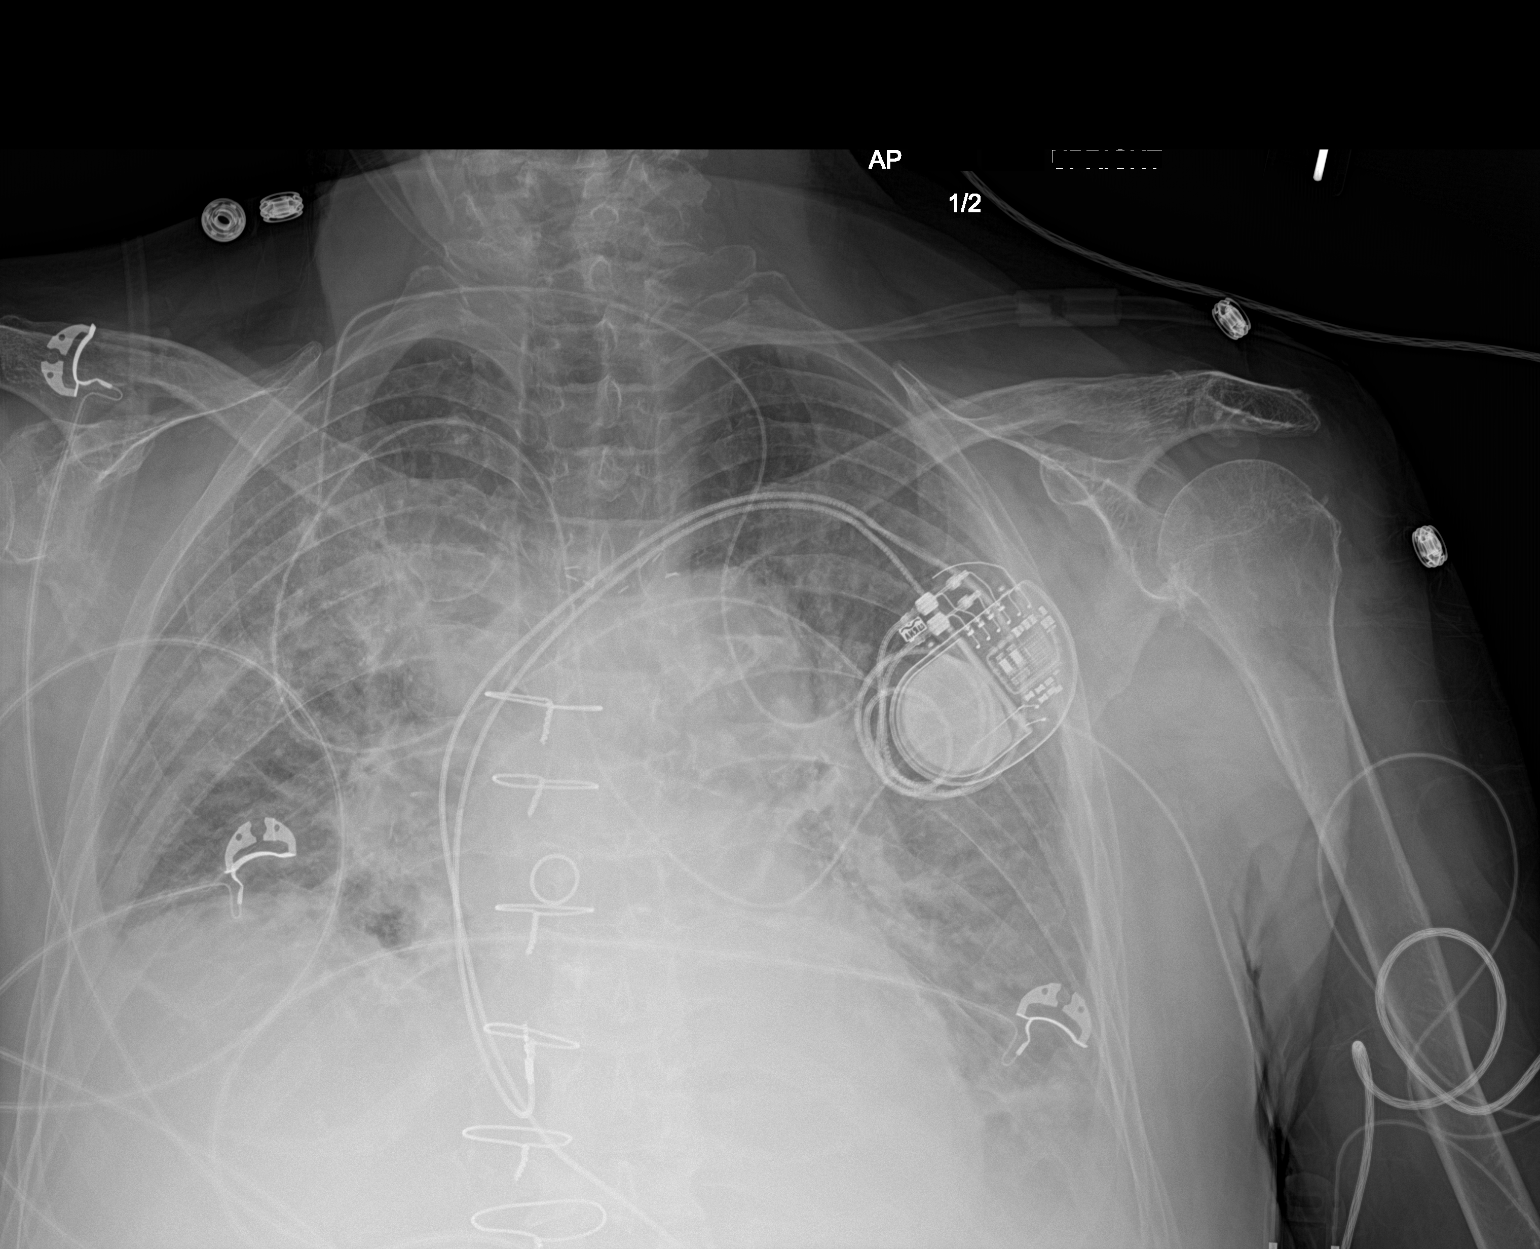

[2 of 2 positions shown; findings below may reference images not displayed]

FINDINGS: The cardiomediastinal silhouette is stable. Increasing diffuse
bilateral pulmonary opacities. Stable pacemaker. No pneumothorax. No
other change.
IMPRESSION: Diffuse worsening pulmonary opacities. Edema is favored. However, an
infectious process is not excluded. Recommend clinical correlation.

## 2020-04-01 IMAGING — DX DG CHEST 1V PORT
1 series · 1 of 1 positions shown · non-contrast
Comparison: 02/27/2018 at 0859 hours

CLINICAL DATA: Endotracheally intubated.

EXAM:
PORTABLE CHEST 1 VIEW

[chest]
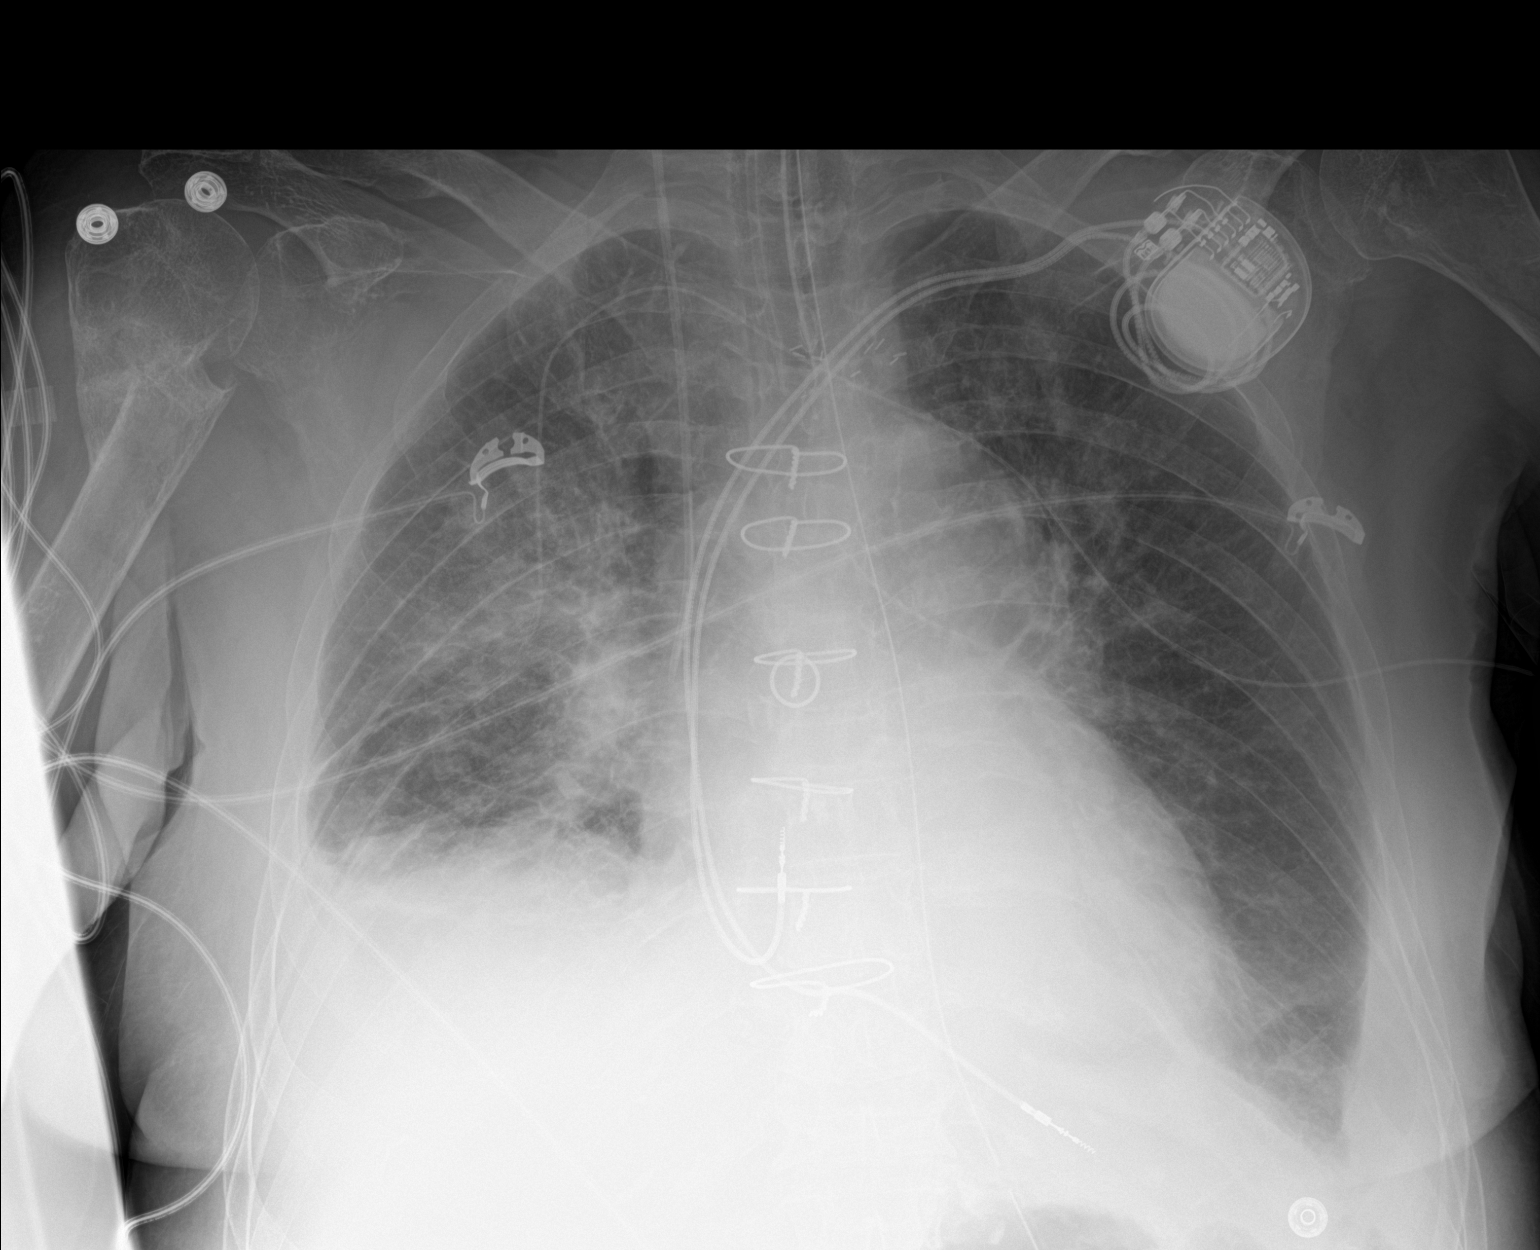

[1 of 1 positions shown; findings below may reference images not displayed]

FINDINGS: An endotracheal tube has been placed and terminates approximately 4
cm above the carina. Enteric tube has been placed and courses into
the left upper abdomen with side hole below the diaphragm and tip
not imaged. A new right jugular catheter terminates over the lower
SVC. A pacemaker remains in place. Sequelae of prior CABG are again
identified. The cardiomediastinal silhouette is unchanged. There is
persistent elevation of the right hemidiaphragm. Bilateral perihilar
and basilar lung opacities have mildly improved, most notably in the
left lung. There are small bilateral pleural effusions. No
pneumothorax is identified. A remote right humeral neck fracture is
noted.
IMPRESSION: 1. Interval intubation with support devices as above.
2. Mild improvement of bilateral lung opacities which may reflect
edema.
3. Small pleural effusions.
